# Patient Record
Sex: Female | Born: 2000 | Race: Black or African American | Hispanic: No | Marital: Single | Smoking: Never smoker
Health system: Southern US, Community
[De-identification: ages and names within clinical notes are randomized; demographics above are authoritative.]

## PROBLEM LIST (undated history)

## (undated) DIAGNOSIS — Z889 Allergy status to unspecified drugs, medicaments and biological substances status: Secondary | ICD-10-CM

## (undated) DIAGNOSIS — F419 Anxiety disorder, unspecified: Secondary | ICD-10-CM

## (undated) DIAGNOSIS — F32A Depression, unspecified: Secondary | ICD-10-CM

## (undated) DIAGNOSIS — O139 Gestational [pregnancy-induced] hypertension without significant proteinuria, unspecified trimester: Secondary | ICD-10-CM

## (undated) DIAGNOSIS — F909 Attention-deficit hyperactivity disorder, unspecified type: Secondary | ICD-10-CM

## (undated) NOTE — *Deleted (*Deleted)
FAMILY CONTACT:   CSW attempted to complete Pt's PSA. Phone number in chart for mother, Sonya [name] ([phone]) was dialed x3. CSW reviewed

---
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
## 2022-05-31 ENCOUNTER — Ambulatory Visit: Payer: Medicaid Other | Admitting: Obstetrics and Gynecology

## 2022-09-21 ENCOUNTER — Encounter (HOSPITAL_COMMUNITY): Payer: Self-pay | Admitting: Emergency Medicine

## 2022-09-21 ENCOUNTER — Inpatient Hospital Stay (HOSPITAL_COMMUNITY)
Admission: AD | Admit: 2022-09-21 | Discharge: 2022-09-21 | Disposition: A | Discharge status: Home / Self Care | Payer: Medicaid Other | Attending: Obstetrics and Gynecology | Admitting: Obstetrics and Gynecology

## 2022-09-21 ENCOUNTER — Other Ambulatory Visit: Payer: Self-pay

## 2022-09-21 DIAGNOSIS — Z3A01 Less than 8 weeks gestation of pregnancy: Secondary | ICD-10-CM | POA: Diagnosis not present

## 2022-09-21 DIAGNOSIS — O219 Vomiting of pregnancy, unspecified: Secondary | ICD-10-CM | POA: Diagnosis present

## 2022-09-21 LAB — URINALYSIS, ROUTINE W REFLEX MICROSCOPIC
Bilirubin Urine: NEGATIVE
Glucose, UA: NEGATIVE mg/dL
Hgb urine dipstick: NEGATIVE
Ketones, ur: 80 mg/dL — AB
Leukocytes,Ua: NEGATIVE
Nitrite: NEGATIVE
Protein, ur: 30 mg/dL — AB
Specific Gravity, Urine: 1.03 (ref 1.005–1.030)
pH: 5 (ref 5.0–8.0)

## 2022-09-21 MED ORDER — FAMOTIDINE IN NACL 20-0.9 MG/50ML-% IV SOLN
20.0000 mg | Freq: Once | INTRAVENOUS | Status: AC
  Administered 2022-09-21: 20 mg via INTRAVENOUS
  Filled 2022-09-21: qty 50

## 2022-09-21 MED ORDER — METOCLOPRAMIDE HCL 10 MG PO TABS: 10.0000 mg | ORAL_TABLET | Freq: Three times a day (TID) | ORAL | 0 refills | Status: DC | PRN

## 2022-09-21 MED ORDER — METOCLOPRAMIDE HCL 5 MG/ML IJ SOLN
10.0000 mg | Freq: Once | INTRAMUSCULAR | Status: AC
  Administered 2022-09-21: 10 mg via INTRAVENOUS
  Filled 2022-09-21: qty 2

## 2022-09-21 MED ORDER — LACTATED RINGERS IV BOLUS
1000.0000 mL | Freq: Once | INTRAVENOUS | Status: AC
  Administered 2022-09-21: 1000 mL via INTRAVENOUS

## 2022-09-21 NOTE — ED Triage Notes (Signed)
Patient arrives ambulatory by POV states over the past couple weeks when she is trying to eat she feels like her body is rejecting it and unable to swallow the food. States she is [redacted] weeks pregnant. Waiting for call from OBGYN.

## 2022-09-21 NOTE — ED Provider Triage Note (Signed)
Emergency Medicine Provider Triage Evaluation Note  Sonya [name] , a 21 y.o. female  was evaluated in triage.  Pt complains of anorexia, vomiting, unable to eat.  Patient is first trimester pregnancy.  She had her pregnancy test confirmed at her doctor's office.  Review of Systems  Positive: Nausea vomiting Negative: No abdominal pain or vaginal bleeding  Physical Exam  BP 128/85   Pulse 84   Temp 98.6 F (37 C) (Oral)   Resp 14   LMP 08/14/2022   SpO2 100%  Gen:   Awake, no distress   Resp:  Normal effort  MSK:   Moves extremities without difficulty  Other:    Medical Decision Making  Medically screening exam initiated at 4:37 PM.  Appropriate orders placed.  Sonya [name] was informed that the remainder of the evaluation will be completed by another provider, this initial triage assessment does not replace that evaluation, and the importance of remaining in the ED until their evaluation is complete.  Discussed with MAU provider Sonya [name].  Patient be transferred to MAU   Sonya Rank, MD 09/21/22 [phone]

## 2022-09-21 NOTE — MAU Note (Signed)
Sonya [name] is a 21 y.o. at [redacted]w[redacted]d here in MAU reporting: states when she found out she was pregnant she was struggling to eat. States her body is "rejecting food". States she has not been able to keep anything down. Has not vomited today. But she does feel some nausea. Also been feeling very tired.   LMP: 08/11/2022  Onset of complaint: ongoing  Pain score: 8/10  Vitals:   09/21/22 1620 09/21/22 1653  BP: 128/85 136/77  Pulse: 84 88  Resp: 14 16  Temp: 98.6 F (37 C) 98.3 F (36.8 C)  SpO2: 100%      FHT:NA  Lab orders placed from triage: UA

## 2022-09-21 NOTE — MAU Provider Note (Signed)
History     [id number]  Arrival date and time: 09/21/22 1542    Chief Complaint  Patient presents with   Nausea     HPI Sonya [name] is a 21 y.o. at [redacted]w[redacted]d who presents for nausea. Symptoms started last week but worsened yesterday. Since yesterday she hasn't been able to eat or swallow. Hasn't vomited today. Does not have antiemetic at home. Denies fever, abdominal pain, diarrhea, dysuria, or vaginal bleeding.    OB History     Gravida  1   Para      Term      Preterm      AB      Living         SAB      IAB      Ectopic      Multiple      Live Births              Past Medical History:  Diagnosis Date   ADHD (attention deficit hyperactivity disorder)    no meds currently   Anxiety    History of seasonal allergies     Past Surgical History:  Procedure Laterality Date   CHOLECYSTECTOMY N/A 01/29/2018   Procedure: LAPAROSCOPIC CHOLECYSTECTOMY WITH INTRAOPERATIVE CHOLANGIOGRAM;  Surgeon: Kandice Hams, MD;  Location: [facility];  Service: Pediatrics;  Laterality: N/A;    Family History  Problem Relation Age of Onset   Epilepsy Mother    Schizophrenia Father    Epilepsy Brother     No Known Allergies  No current facility-administered medications on file prior to encounter.   Current Outpatient Medications on File Prior to Encounter  Medication Sig Dispense Refill   Prenatal Vit-Fe Fumarate-FA (WESTAB PLUS) 27-1 MG TABS Take 1 tablet by mouth daily.       ROS Pertinent positives and negative per HPI, all others reviewed and negative  Physical Exam   BP 136/77 (BP Location: Right Arm)   Pulse 88   Temp 98.3 F (36.8 C) (Oral)   Resp 16   Ht 5\' 4"  (1.626 m)   Wt 63.1 kg   LMP 08/14/2022   SpO2 100%   BMI 23.88 kg/m   Patient Vitals for the past 24 hrs:  BP Temp Temp src Pulse Resp SpO2 Height Weight  09/21/22 1653 136/77 98.3 F (36.8 C) Oral 88 16 -- -- --  09/21/22 1649 -- -- -- -- -- -- 5\' 4"  (1.626 m) 63.1 kg  09/21/22  1620 128/85 98.6 F (37 C) Oral 84 14 100 % -- --    Physical Exam Vitals and nursing note reviewed.  Constitutional:      General: She is not in acute distress.    Appearance: Normal appearance.  HENT:     Head: Normocephalic and atraumatic.  Eyes:     General: No scleral icterus.    Conjunctiva/sclera: Conjunctivae normal.  Pulmonary:     Effort: Pulmonary effort is normal. No respiratory distress.  Abdominal:     Palpations: Abdomen is soft.     Tenderness: There is no abdominal tenderness.  Neurological:     Mental Status: She is alert.  Psychiatric:        Mood and Affect: Mood normal.        Behavior: Behavior normal.      Labs Results for orders placed or performed during the hospital encounter of 09/21/22 (from the past 24 hour(s))  Urinalysis, Routine w reflex microscopic Urine, Clean Catch  Status: Abnormal   Collection Time: 09/21/22  4:59 PM  Result Value Ref Range   Color, Urine AMBER (A) YELLOW   APPearance HAZY (A) CLEAR   Specific Gravity, Urine 1.030 1.005 - 1.030   pH 5.0 5.0 - 8.0   Glucose, UA NEGATIVE NEGATIVE mg/dL   Hgb urine dipstick NEGATIVE NEGATIVE   Bilirubin Urine NEGATIVE NEGATIVE   Ketones, ur 80 (A) NEGATIVE mg/dL   Protein, ur 30 (A) NEGATIVE mg/dL   Nitrite NEGATIVE NEGATIVE   Leukocytes,Ua NEGATIVE NEGATIVE   RBC / HPF 0-5 0 - 5 RBC/hpf   WBC, UA 0-5 0 - 5 WBC/hpf   Bacteria, UA RARE (A) NONE SEEN   Squamous Epithelial / LPF 0-5 0 - 5   Mucus PRESENT     Imaging No results found.  MAU Course  Procedures Lab Orders         Urinalysis, Routine w reflex microscopic Urine, Clean Catch    Meds ordered this encounter  Medications   lactated ringers bolus 1,000 mL   metoCLOPramide (REGLAN) injection 10 mg   famotidine (PEPCID) IVPB 20 mg premix   metoCLOPramide (REGLAN) 10 MG tablet    Sig: Take 1 tablet (10 mg total) by mouth every 8 (eight) hours as needed for nausea or vomiting.    Dispense:  30 tablet    Refill:  0     Order Specific Question:   Supervising Provider    Answer:   Griffin Basil [[id number]]   Imaging Orders  No imaging studies ordered today    MDM Presents with nausea - no abdominal pain or vaginal bleeding. Treated in MAU with IV fluids, reglan, & pepcid. Reports improvement in symptoms & able to swallow drink & crackers.  Assessment and Plan   1. Nausea and vomiting during pregnancy prior to [redacted] weeks gestation   2. [redacted] weeks gestation of pregnancy    -Rx reglan  -Reviewed reasons to return to MAU -Start prenatal care  Jorje Guild, NP 09/21/22 7:42 PM

## 2022-10-02 ENCOUNTER — Encounter (HOSPITAL_COMMUNITY): Payer: Self-pay | Admitting: Obstetrics & Gynecology

## 2022-10-02 ENCOUNTER — Inpatient Hospital Stay (HOSPITAL_COMMUNITY)
Admission: AD | Admit: 2022-10-02 | Discharge: 2022-10-02 | Disposition: A | Discharge status: Home / Self Care | Payer: Medicaid Other | Attending: Obstetrics & Gynecology | Admitting: Obstetrics & Gynecology

## 2022-10-02 ENCOUNTER — Other Ambulatory Visit: Payer: Self-pay

## 2022-10-02 DIAGNOSIS — O26891 Other specified pregnancy related conditions, first trimester: Secondary | ICD-10-CM | POA: Insufficient documentation

## 2022-10-02 DIAGNOSIS — Z3A01 Less than 8 weeks gestation of pregnancy: Secondary | ICD-10-CM | POA: Diagnosis not present

## 2022-10-02 DIAGNOSIS — O218 Other vomiting complicating pregnancy: Secondary | ICD-10-CM | POA: Diagnosis not present

## 2022-10-02 DIAGNOSIS — O219 Vomiting of pregnancy, unspecified: Secondary | ICD-10-CM

## 2022-10-02 LAB — CBC WITH DIFFERENTIAL/PLATELET
Abs Immature Granulocytes: 0.02 10*3/uL (ref 0.00–0.07)
Basophils Absolute: 0 10*3/uL (ref 0.0–0.1)
Basophils Relative: 0 %
Eosinophils Absolute: 0.1 10*3/uL (ref 0.0–0.5)
Eosinophils Relative: 2 %
HCT: 37.3 % (ref 36.0–46.0)
Hemoglobin: 12.5 g/dL (ref 12.0–15.0)
Immature Granulocytes: 0 %
Lymphocytes Relative: 18 %
Lymphs Abs: 1.1 10*3/uL (ref 0.7–4.0)
MCH: 30.2 pg (ref 26.0–34.0)
MCHC: 33.5 g/dL (ref 30.0–36.0)
MCV: 90.1 fL (ref 80.0–100.0)
Monocytes Absolute: 0.4 10*3/uL (ref 0.1–1.0)
Monocytes Relative: 6 %
Neutro Abs: 4.3 10*3/uL (ref 1.7–7.7)
Neutrophils Relative %: 74 %
Platelets: 205 10*3/uL (ref 150–400)
RBC: 4.14 MIL/uL (ref 3.87–5.11)
RDW: 11.9 % (ref 11.5–15.5)
WBC: 5.8 10*3/uL (ref 4.0–10.5)
nRBC: 0 % (ref 0.0–0.2)

## 2022-10-02 LAB — COMPREHENSIVE METABOLIC PANEL
ALT: 11 U/L (ref 0–44)
AST: 18 U/L (ref 15–41)
Albumin: 4 g/dL (ref 3.5–5.0)
Alkaline Phosphatase: 33 U/L — ABNORMAL LOW (ref 38–126)
Anion gap: 9 (ref 5–15)
BUN: 6 mg/dL (ref 6–20)
CO2: 23 mmol/L (ref 22–32)
Calcium: 9.4 mg/dL (ref 8.9–10.3)
Chloride: 106 mmol/L (ref 98–111)
Creatinine, Ser: 0.67 mg/dL (ref 0.44–1.00)
GFR, Estimated: >60 mL/min (ref >60)
Glucose, Bld: 100 mg/dL — ABNORMAL HIGH (ref 70–99)
Potassium: 4.4 mmol/L (ref 3.5–5.1)
Sodium: 138 mmol/L (ref 135–145)
Total Bilirubin: 0.4 mg/dL (ref 0.3–1.2)
Total Protein: 6.7 g/dL (ref 6.5–8.1)

## 2022-10-02 LAB — URINALYSIS, ROUTINE W REFLEX MICROSCOPIC
Bilirubin Urine: NEGATIVE
Glucose, UA: NEGATIVE mg/dL
Hgb urine dipstick: NEGATIVE
Ketones, ur: NEGATIVE mg/dL
Leukocytes,Ua: NEGATIVE
Nitrite: NEGATIVE
Protein, ur: NEGATIVE mg/dL
Specific Gravity, Urine: 1.025 (ref 1.005–1.030)
pH: 8 (ref 5.0–8.0)

## 2022-10-02 MED ORDER — PROMETHAZINE HCL 12.5 MG PO TABS: 12.5000 mg | ORAL_TABLET | Freq: Four times a day (QID) | ORAL | 0 refills | Status: DC | PRN

## 2022-10-02 NOTE — MAU Provider Note (Signed)
History     CSN: [id number]  Arrival date and time: 10/02/22 [id number]   Event Date/Time   First Provider Initiated Contact with Patient 10/02/22 1118      Chief Complaint  Patient presents with   Nausea   Emesis   Fatigue   HPI Aalani Mcnellis is a 21 y.o. G1P0 at [redacted]w[redacted]d who presents to MAU with chief complaint of vomiting. This is a recurrent problem for which patient was prescribed Reglan on 09/21/2022. Patient states the Reglan is working for her but she is typically only able to tolerate a small number of different foods. She confirms she is tolerating PO liquids and solids. Patient states she is concerned because she has missed two weeks of work due to feeling weak.  Patient reports one episode of syncope two days ago. She was preparing food in her mother's kitchen. Her mother witness the episode and told the patient she slowly lowered herself to the floor. Patient denies physical complaints from her fall. She denies chest pain, palpitations, hx of cardiac issues.  OB History     Gravida  1   Para      Term      Preterm      AB      Living         SAB      IAB      Ectopic      Multiple      Live Births              Past Medical History:  Diagnosis Date   ADHD (attention deficit hyperactivity disorder)    no meds currently   Anxiety    History of seasonal allergies     Past Surgical History:  Procedure Laterality Date   CHOLECYSTECTOMY N/A 01/29/2018   Procedure: LAPAROSCOPIC CHOLECYSTECTOMY WITH INTRAOPERATIVE CHOLANGIOGRAM;  Surgeon: Stanford Scotland, MD;  Location: [facility];  Service: Pediatrics;  Laterality: N/A;    Family History  Problem Relation Age of Onset   Epilepsy Mother    Schizophrenia Father    Epilepsy Brother     Social History   Tobacco Use   Smoking status: Never   Smokeless tobacco: Never  Vaping Use   Vaping Use: Never used  Substance Use Topics   Alcohol use: Not Currently   Drug use: Yes    Types: Marijuana     Comment: last smoked 2 weeks ago (early October 2023)    Allergies: No Known Allergies  Medications Prior to Admission  Medication Sig Dispense Refill Last Dose   metoCLOPramide (REGLAN) 10 MG tablet Take 1 tablet (10 mg total) by mouth every 8 (eight) hours as needed for nausea or vomiting. 30 tablet 0 10/01/2022 at 1900   Prenatal Vit-Fe Fumarate-FA (WESTAB PLUS) 27-1 MG TABS Take 1 tablet by mouth daily.       Review of Systems  Gastrointestinal:  Positive for nausea and vomiting.  Neurological:  Positive for syncope.  All other systems reviewed and are negative.  Physical Exam   Blood pressure 130/69, pulse 93, temperature 98.2 F (36.8 C), temperature source Oral, resp. rate 18, height 5\' 4"  (1.626 m), weight 63.2 kg, last menstrual period 08/14/2022, SpO2 100 %.  Physical Exam Vitals and nursing note reviewed. Exam conducted with a chaperone present.  Constitutional:      General: She is not in acute distress.    Appearance: She is not ill-appearing.  Cardiovascular:     Rate and Rhythm: Normal rate  and regular rhythm.     Pulses: Normal pulses.     Heart sounds: Normal heart sounds.  Pulmonary:     Effort: Pulmonary effort is normal.     Breath sounds: Normal breath sounds.  Abdominal:     General: Abdomen is flat.  Skin:    General: Skin is warm.     Capillary Refill: Capillary refill takes less than 2 seconds.  Neurological:     Mental Status: She is alert and oriented to person, place, and time.  Psychiatric:        Mood and Affect: Mood normal.        Behavior: Behavior normal.        Thought Content: Thought content normal.        Judgment: Judgment normal.     MAU Course  Procedures  MDM Orders Placed This Encounter  Procedures   Urinalysis, Routine w reflex microscopic Urine, Clean Catch   CBC with Differential/Platelet   Comprehensive metabolic panel   ED EKG   Discharge patient   Patient Vitals for the past 24 hrs:  BP Temp Temp src Pulse  Resp SpO2 Height Weight  10/02/22 1150 119/65 98.5 F (36.9 C) Oral 83 19 100 % -- --  10/02/22 1009 130/69 98.2 F (36.8 C) Oral 93 18 100 % -- --  10/02/22 1005 -- -- -- -- -- -- 5\' 4"  (1.626 m) 63.2 kg   Results for orders placed or performed during the hospital encounter of 10/02/22 (from the past 24 hour(s))  Urinalysis, Routine w reflex microscopic Urine, Clean Catch     Status: None   Collection Time: 10/02/22 10:18 AM  Result Value Ref Range   Color, Urine YELLOW YELLOW   APPearance CLEAR CLEAR   Specific Gravity, Urine 1.025 1.005 - 1.030   pH 8.0 5.0 - 8.0   Glucose, UA NEGATIVE NEGATIVE mg/dL   Hgb urine dipstick NEGATIVE NEGATIVE   Bilirubin Urine NEGATIVE NEGATIVE   Ketones, ur NEGATIVE NEGATIVE mg/dL   Protein, ur NEGATIVE NEGATIVE mg/dL   Nitrite NEGATIVE NEGATIVE   Leukocytes,Ua NEGATIVE NEGATIVE  CBC with Differential/Platelet     Status: None   Collection Time: 10/02/22 10:34 AM  Result Value Ref Range   WBC 5.8 4.0 - 10.5 K/uL   RBC 4.14 3.87 - 5.11 MIL/uL   Hemoglobin 12.5 12.0 - 15.0 g/dL   HCT 37.3 36.0 - 46.0 %   MCV 90.1 80.0 - 100.0 fL   MCH 30.2 26.0 - 34.0 pg   MCHC 33.5 30.0 - 36.0 g/dL   RDW 11.9 11.5 - 15.5 %   Platelets 205 150 - 400 K/uL   nRBC 0.0 0.0 - 0.2 %   Neutrophils Relative % 74 %   Neutro Abs 4.3 1.7 - 7.7 K/uL   Lymphocytes Relative 18 %   Lymphs Abs 1.1 0.7 - 4.0 K/uL   Monocytes Relative 6 %   Monocytes Absolute 0.4 0.1 - 1.0 K/uL   Eosinophils Relative 2 %   Eosinophils Absolute 0.1 0.0 - 0.5 K/uL   Basophils Relative 0 %   Basophils Absolute 0.0 0.0 - 0.1 K/uL   Immature Granulocytes 0 %   Abs Immature Granulocytes 0.02 0.00 - 0.07 K/uL  Comprehensive metabolic panel     Status: Abnormal   Collection Time: 10/02/22 10:34 AM  Result Value Ref Range   Sodium 138 135 - 145 mmol/L   Potassium 4.4 3.5 - 5.1 mmol/L   Chloride 106 98 - 111  mmol/L   CO2 23 22 - 32 mmol/L   Glucose, Bld 100 (H) 70 - 99 mg/dL   BUN 6 6 - 20  mg/dL   Creatinine, Ser 0.67 0.44 - 1.00 mg/dL   Calcium 9.4 8.9 - 10.3 mg/dL   Total Protein 6.7 6.5 - 8.1 g/dL   Albumin 4.0 3.5 - 5.0 g/dL   AST 18 15 - 41 U/L   ALT 11 0 - 44 U/L   Alkaline Phosphatase 33 (L) 38 - 126 U/L   Total Bilirubin 0.4 0.3 - 1.2 mg/dL   GFR, Estimated >60 >60 mL/min   Anion gap 9 5 - 15   Meds ordered this encounter  Medications   promethazine (PHENERGAN) 12.5 MG tablet    Sig: Take 1 tablet (12.5 mg total) by mouth every 6 (six) hours as needed for nausea or vomiting.    Dispense:  30 tablet    Refill:  0    Order Specific Question:   Supervising Provider    Answer:   Janyth Pupa [[id number]]   Assessment and Plan  -21 y.o. G1P0 at [redacted]w[redacted]d  --Well appearing --Vomiting well managed with previously prescribed medications and diet restriction --No ketonuria assessed today --Second antiemetic agent per patient request --Syncope x 1 episode two days ago --S/p normal ECG with NSR in MAU --Discharge home in stable condition  Darlina Rumpf, [place], MSN, CNM 10/02/2022, 12:38 PM

## 2022-10-02 NOTE — Discharge Instructions (Signed)
Vomiting in First Trimester Follow these instructions at home: To help relieve your symptoms, listen to your body. Everyone is different and has different preferences. Find what works best for you. Here are some things you can try to help relieve your symptoms: Meals and snacks Eat 5-6 small meals daily instead of 3 large meals. Eating small meals and snacks can help you avoid an empty stomach. Before getting out of bed, eat a couple of crackers to avoid moving around on an empty stomach. Eat a protein-rich snack before bed. Examples include cheese and crackers, or a peanut butter sandwich made with 1 slice of whole-wheat bread and 1 tsp (5 g) of peanut butter. Eat and drink slowly. Try eating starchy foods as these are usually tolerated well. Examples include cereal, toast, bread, potatoes, pasta, rice, and pretzels. Eat at least one serving of protein with your meals and snacks. Protein options include lean meats, poultry, seafood, beans, nuts, nut butters, eggs, cheese, and yogurt. Eat or suck on things that have ginger in them. It may help to relieve nausea. Add  tsp (0.44 g) ground ginger to hot tea, or choose ginger tea.   Fluids It is important to stay hydrated. Try to: Drink small amounts of fluids often. Drink fluids 30 minutes before or after a meal to help lessen the feeling of a full stomach. Drink 100% fruit juice or an electrolyte drink. An electrolyte drink contains sodium, potassium, and chloride. Drink fluids that are cold, clear, and carbonated or sour. These include lemonade, ginger ale, lemon-lime soda, ice water, and sparkling water. Things to avoid Avoid the following: Eating foods that trigger your symptoms. These may include spicy foods, coffee, high-fat foods, very sweet foods, and acidic foods. Drinking more than 1 cup of fluid at a time. Skipping meals. Nausea can be more intense on an empty stomach. If you cannot tolerate food, do not force it. Try sucking on ice  chips or other frozen items and make up for missed calories later. Lying down within 2 hours after eating. Being exposed to environmental triggers. These may include food smells, smoky rooms, closed spaces, rooms with strong smells, warm or humid places, overly loud and noisy rooms, and rooms with motion or flickering lights. Try eating meals in a well-ventilated area that is free of strong smells. Making quick and sudden changes in your movement. Taking iron pills and multivitamins that contain iron. If you take prescription iron pills, do not stop taking them unless your health care provider approves. Preparing food. The smell of food can spoil your appetite or trigger nausea. General instructions Brush your teeth or use a mouth rinse after meals. Take over-the-counter and prescription medicines only as told by your health care provider. Follow instructions from your health care provider about eating or drinking restrictions. Talk with your health care provider about starting a supplement of vitamin B6. Continue to take your prenatal vitamins as told by your health care provider. If you are having trouble taking your prenatal vitamins, talk with your health care provider about other options. Keep all follow-up visits. This is important. Follow-up visits include prenatal visits. Contact a health care provider if: You have pain in your abdomen. You have a severe headache. You have vision problems. You are losing weight. You feel weak or dizzy. You cannot eat or drink without vomiting, especially if this goes on for a full day. Get help right away if: You cannot drink fluids without vomiting. You vomit blood. You have constant   nausea and vomiting. You are very weak. You faint. You have a fever and your symptoms suddenly get worse. Summary Making some changes to your eating habits may help relieve nausea and vomiting. This condition may be managed with lifestyle changes and medicines as  prescribed by your health care provider. If medicines do not help relieve nausea and vomiting, you may need to receive fluids through an IV at the hospital. This information is not intended to replace advice given to you by your health care provider. Make sure you discuss any questions you have with your health care provider. Document Revised: 06/27/2020 Document Reviewed: 06/27/2020 Elsevier Patient Education  2021 [employer]  

## 2022-10-02 NOTE — MAU Note (Signed)
Sonya [name] is a 21 y.o. at [redacted]w[redacted]d here in MAU reporting: she's has N/V and "low energy".  Reports abel to keep both fluids and "light" foods down.  Reports hasn't vomited in last 24 hours. Reports fainted 2 days ago. LMP: N/A Onset of complaint: 1 week since fatigued Pain score: 0 Vitals:   10/02/22 1009  BP: 130/69  Pulse: 93  Resp: 18  Temp: 98.2 F (36.8 C)  SpO2: 100%     FHT:N/A Lab orders placed from triage:   UA

## 2022-10-02 NOTE — Progress Notes (Signed)
Pt reports she no longer has a legal guardian.  Legal guardian was for previous behavioral health admission @ age 21.

## 2022-10-16 ENCOUNTER — Encounter (HOSPITAL_COMMUNITY): Payer: Self-pay

## 2022-10-16 ENCOUNTER — Inpatient Hospital Stay (HOSPITAL_COMMUNITY)
Admission: EM | Admit: 2022-10-16 | Discharge: 2022-10-16 | Disposition: A | Discharge status: Home / Self Care | Payer: Medicaid Other | Attending: Obstetrics and Gynecology | Admitting: Obstetrics and Gynecology

## 2022-10-16 DIAGNOSIS — Z3A09 9 weeks gestation of pregnancy: Secondary | ICD-10-CM | POA: Diagnosis not present

## 2022-10-16 DIAGNOSIS — Z79899 Other long term (current) drug therapy: Secondary | ICD-10-CM | POA: Diagnosis not present

## 2022-10-16 DIAGNOSIS — O99611 Diseases of the digestive system complicating pregnancy, first trimester: Secondary | ICD-10-CM | POA: Insufficient documentation

## 2022-10-16 DIAGNOSIS — R55 Syncope and collapse: Secondary | ICD-10-CM | POA: Diagnosis not present

## 2022-10-16 DIAGNOSIS — O99321 Drug use complicating pregnancy, first trimester: Secondary | ICD-10-CM | POA: Insufficient documentation

## 2022-10-16 DIAGNOSIS — K5900 Constipation, unspecified: Secondary | ICD-10-CM | POA: Insufficient documentation

## 2022-10-16 DIAGNOSIS — R519 Headache, unspecified: Secondary | ICD-10-CM | POA: Diagnosis not present

## 2022-10-16 DIAGNOSIS — F410 Panic disorder [episodic paroxysmal anxiety] without agoraphobia: Secondary | ICD-10-CM | POA: Insufficient documentation

## 2022-10-16 DIAGNOSIS — O210 Mild hyperemesis gravidarum: Secondary | ICD-10-CM | POA: Diagnosis not present

## 2022-10-16 DIAGNOSIS — R112 Nausea with vomiting, unspecified: Secondary | ICD-10-CM | POA: Diagnosis not present

## 2022-10-16 DIAGNOSIS — F1290 Cannabis use, unspecified, uncomplicated: Secondary | ICD-10-CM | POA: Diagnosis not present

## 2022-10-16 DIAGNOSIS — R42 Dizziness and giddiness: Secondary | ICD-10-CM | POA: Diagnosis not present

## 2022-10-16 DIAGNOSIS — S0990XA Unspecified injury of head, initial encounter: Secondary | ICD-10-CM

## 2022-10-16 DIAGNOSIS — R0789 Other chest pain: Secondary | ICD-10-CM | POA: Diagnosis not present

## 2022-10-16 DIAGNOSIS — R0602 Shortness of breath: Secondary | ICD-10-CM | POA: Insufficient documentation

## 2022-10-16 DIAGNOSIS — K219 Gastro-esophageal reflux disease without esophagitis: Secondary | ICD-10-CM

## 2022-10-16 DIAGNOSIS — W2209XA Striking against other stationary object, initial encounter: Secondary | ICD-10-CM | POA: Insufficient documentation

## 2022-10-16 DIAGNOSIS — O26891 Other specified pregnancy related conditions, first trimester: Secondary | ICD-10-CM | POA: Insufficient documentation

## 2022-10-16 DIAGNOSIS — Z1152 Encounter for screening for COVID-19: Secondary | ICD-10-CM | POA: Insufficient documentation

## 2022-10-16 LAB — COMPREHENSIVE METABOLIC PANEL
ALT: 17 U/L (ref 0–44)
AST: 21 U/L (ref 15–41)
Albumin: 4.4 g/dL (ref 3.5–5.0)
Alkaline Phosphatase: 32 U/L — ABNORMAL LOW (ref 38–126)
Anion gap: 14 (ref 5–15)
BUN: 9 mg/dL (ref 6–20)
CO2: 18 mmol/L — ABNORMAL LOW (ref 22–32)
Calcium: 10.4 mg/dL — ABNORMAL HIGH (ref 8.9–10.3)
Chloride: 105 mmol/L (ref 98–111)
Creatinine, Ser: 0.69 mg/dL (ref 0.44–1.00)
GFR, Estimated: >60 mL/min (ref >60)
Glucose, Bld: 92 mg/dL (ref 70–99)
Potassium: 4.7 mmol/L (ref 3.5–5.1)
Sodium: 137 mmol/L (ref 135–145)
Total Bilirubin: 1.3 mg/dL — ABNORMAL HIGH (ref 0.3–1.2)
Total Protein: 7.4 g/dL (ref 6.5–8.1)

## 2022-10-16 LAB — URINALYSIS, ROUTINE W REFLEX MICROSCOPIC
Bacteria, UA: NONE SEEN
Bilirubin Urine: NEGATIVE
Glucose, UA: NEGATIVE mg/dL
Hgb urine dipstick: NEGATIVE
Ketones, ur: 80 mg/dL — AB
Leukocytes,Ua: NEGATIVE
Nitrite: NEGATIVE
Protein, ur: 30 mg/dL — AB
Specific Gravity, Urine: 1.032 — ABNORMAL HIGH (ref 1.005–1.030)
pH: 5 (ref 5.0–8.0)

## 2022-10-16 LAB — CBC WITH DIFFERENTIAL/PLATELET
Abs Immature Granulocytes: 0.01 10*3/uL (ref 0.00–0.07)
Basophils Absolute: 0 10*3/uL (ref 0.0–0.1)
Basophils Relative: 0 %
Eosinophils Absolute: 0 10*3/uL (ref 0.0–0.5)
Eosinophils Relative: 0 %
HCT: 38.5 % (ref 36.0–46.0)
Hemoglobin: 13.1 g/dL (ref 12.0–15.0)
Immature Granulocytes: 0 %
Lymphocytes Relative: 14 %
Lymphs Abs: 0.9 10*3/uL (ref 0.7–4.0)
MCH: 30.4 pg (ref 26.0–34.0)
MCHC: 34 g/dL (ref 30.0–36.0)
MCV: 89.3 fL (ref 80.0–100.0)
Monocytes Absolute: 0.4 10*3/uL (ref 0.1–1.0)
Monocytes Relative: 7 %
Neutro Abs: 4.9 10*3/uL (ref 1.7–7.7)
Neutrophils Relative %: 79 %
Platelets: 268 10*3/uL (ref 150–400)
RBC: 4.31 MIL/uL (ref 3.87–5.11)
RDW: 12 % (ref 11.5–15.5)
WBC: 6.2 10*3/uL (ref 4.0–10.5)
nRBC: 0 % (ref 0.0–0.2)

## 2022-10-16 LAB — RESP PANEL BY RT-PCR (FLU A&B, COVID) ARPGX2
Influenza A by PCR: NEGATIVE
Influenza B by PCR: NEGATIVE
SARS Coronavirus 2 by RT PCR: NEGATIVE

## 2022-10-16 LAB — HCG, QUANTITATIVE, PREGNANCY: hCG, Beta Chain, Quant, S: 198516 m[IU]/mL — ABNORMAL HIGH (ref <5)

## 2022-10-16 LAB — LIPASE, BLOOD: Lipase: 25 U/L (ref 11–51)

## 2022-10-16 MED ORDER — PROMETHAZINE HCL 25 MG PO TABS
25.0000 mg | ORAL_TABLET | Freq: Once | ORAL | Status: AC
  Administered 2022-10-16: 25 mg via ORAL
  Filled 2022-10-16: qty 1

## 2022-10-16 MED ORDER — ONDANSETRON HCL 4 MG/2ML IJ SOLN
4.0000 mg | Freq: Once | INTRAMUSCULAR | Status: AC
  Administered 2022-10-16: 4 mg via INTRAVENOUS
  Filled 2022-10-16: qty 2

## 2022-10-16 MED ORDER — LACTATED RINGERS IV BOLUS
1000.0000 mL | Freq: Once | INTRAVENOUS | Status: AC
  Administered 2022-10-16: 1000 mL via INTRAVENOUS

## 2022-10-16 MED ORDER — PANTOPRAZOLE SODIUM 40 MG IV SOLR
40.0000 mg | Freq: Once | INTRAVENOUS | Status: AC
  Administered 2022-10-16: 40 mg via INTRAVENOUS
  Filled 2022-10-16: qty 10

## 2022-10-16 MED ORDER — PANTOPRAZOLE SODIUM 20 MG PO TBEC: 20.0000 mg | DELAYED_RELEASE_TABLET | Freq: Every day | ORAL | 6 refills | Status: DC

## 2022-10-16 MED ORDER — POLYETHYLENE GLYCOL 3350 17 GM/SCOOP PO POWD: 17.0000 g | Freq: Two times a day (BID) | ORAL | 2 refills | Status: DC | PRN

## 2022-10-16 MED ORDER — ONDANSETRON HCL 8 MG PO TABS: 8.0000 mg | ORAL_TABLET | Freq: Four times a day (QID) | ORAL | 3 refills | Status: DC | PRN

## 2022-10-16 MED ORDER — GLYCOPYRROLATE 0.2 MG/ML IJ SOLN
0.1000 mg | Freq: Once | INTRAMUSCULAR | Status: AC
  Administered 2022-10-16: 0.1 mg via INTRAVENOUS
  Filled 2022-10-16: qty 1

## 2022-10-16 MED ORDER — FAMOTIDINE IN NACL 20-0.9 MG/50ML-% IV SOLN
20.0000 mg | Freq: Once | INTRAVENOUS | Status: AC
  Administered 2022-10-16: 20 mg via INTRAVENOUS
  Filled 2022-10-16: qty 50

## 2022-10-16 MED ORDER — M.V.I. ADULT IV INJ
Freq: Once | INTRAVENOUS | Status: AC
  Filled 2022-10-16: qty 1000

## 2022-10-16 MED ORDER — PROMETHAZINE HCL 12.5 MG PO TABS: 25.0000 mg | ORAL_TABLET | Freq: Four times a day (QID) | ORAL | 3 refills | Status: DC | PRN

## 2022-10-16 MED ORDER — GLYCOPYRROLATE 2 MG PO TABS: 2.0000 mg | ORAL_TABLET | Freq: Three times a day (TID) | ORAL | 3 refills | Status: DC | PRN

## 2022-10-16 NOTE — MAU Provider Note (Cosign Needed Addendum)
History    CSN: [id number]  Arrival date and time: 10/16/22 1138   Event Date/Time   First Provider Initiated Contact with Patient 10/16/22 1311      Chief Complaint  Patient presents with   Emesis   Dizziness   Near Syncope   HPI Sonya [name] is a 21 y.o. G1P0 [redacted]w[redacted]d who presents to MAU after weeks of vomiting and a fall today in which she states she hit her head. She states she has been having worsened vomiting the past week which has not been controlled with medication. She tried to take phenergan yesterday but could not keep it down. She states that she had very bad nausea/vomiting since last night. She states she has not been able to keep any food/water down. She vomited twice when she got up and was not feeling well. She states that she was laying in bed and began having chest tightness and SOB. She called her mother and was told to call EMS. EMS asked her to unlock the door for them. She went to go unlock the door and began to get dizzy/lightheaded when walking to the door. She states that when she turned after unlocking the door she fell to her knees and then hit the left side of her head. She states she lost consciousness and didn't regain consciousness until EMS arrived. She is unsure how long she had LOC. She states when she regained consciousness EMS was around her and she had a panic attack. She says after her fall she has a pulsatile left sided headache. Her nausea/vomiting has not changed since fall. No visual disturbances after her fall.    (CNM Addendum: Pt gave conflicting reports to various staff regarding whether she lost consciousness or not. She stated to CNM that she could hear people talking throughout.)  OB History     Gravida  1   Para      Term      Preterm      AB      Living         SAB      IAB      Ectopic      Multiple      Live Births              Past Medical History:  Diagnosis Date   ADHD (attention deficit hyperactivity  disorder)    no meds currently   Anxiety    History of seasonal allergies     Past Surgical History:  Procedure Laterality Date   CHOLECYSTECTOMY N/A 01/29/2018   Procedure: LAPAROSCOPIC CHOLECYSTECTOMY WITH INTRAOPERATIVE CHOLANGIOGRAM;  Surgeon: Kandice Hams, MD;  Location: [facility] OR;  Service: Pediatrics;  Laterality: N/A;    Family History  Problem Relation Age of Onset   Epilepsy Mother    Schizophrenia Father    Epilepsy Brother     Social History   Tobacco Use   Smoking status: Never   Smokeless tobacco: Never  Vaping Use   Vaping Use: Never used  Substance Use Topics   Alcohol use: Not Currently   Drug use: Yes    Types: Marijuana    Comment: last smoked 2 weeks ago (early October 2023)    Allergies: No Known Allergies  Medications Prior to Admission  Medication Sig Dispense Refill Last Dose   metoCLOPramide (REGLAN) 10 MG tablet Take 1 tablet (10 mg total) by mouth every 8 (eight) hours as needed for nausea or vomiting. 30 tablet 0  Prenatal Vit-Fe Fumarate-FA (WESTAB PLUS) 27-1 MG TABS Take 1 tablet by mouth daily.      promethazine (PHENERGAN) 12.5 MG tablet Take 1 tablet (12.5 mg total) by mouth every 6 (six) hours as needed for nausea or vomiting. 30 tablet 0     Review of Systems  Constitutional:  Positive for appetite change. Negative for fever.  Eyes:  Negative for photophobia and visual disturbance.  Respiratory:  Positive for chest tightness, shortness of breath and stridor.   Cardiovascular:  Positive for chest pain.  Gastrointestinal:  Positive for abdominal pain, constipation, nausea and vomiting. Negative for blood in stool and diarrhea.  Neurological:  Positive for dizziness, light-headedness and headaches (L sided, pulsatile).   Physical Exam   Blood pressure 113/67, pulse 87, temperature 99.8 F (37.7 C), temperature source Oral, resp. rate 16, height 5\' 4"  (1.626 m), weight 60.8 kg, last menstrual period 08/14/2022, SpO2 100  %.  Physical Exam Constitutional:      Appearance: Normal appearance. She is normal weight.  Cardiovascular:     Rate and Rhythm: Normal rate and regular rhythm.  Pulmonary:     Effort: Pulmonary effort is normal.     Breath sounds: Normal breath sounds.     Comments: Pt was having possible stridor on first assessment by CNM, but breathing had normalized by my assessment Abdominal:     General: Abdomen is flat. Bowel sounds are normal.     Palpations: Abdomen is soft.  Musculoskeletal:     Cervical back: Normal range of motion.  Skin:    General: Skin is warm and dry.  Neurological:     General: No focal deficit present.     Mental Status: She is alert and oriented to person, place, and time. Mental status is at baseline.  Psychiatric:        Mood and Affect: Mood normal.        Behavior: Behavior normal.        Judgment: Judgment normal.    MAU Course  Procedures Meds ordered this encounter  Medications   ondansetron (ZOFRAN) injection 4 mg   lactated ringers bolus 1,000 mL   famotidine (PEPCID) IVPB 20 mg premix   lactated ringers 1,000 mL with M.V.I. Adult (INFUVITE ADULT) 10 mL infusion   glycopyrrolate (ROBINUL) injection 0.1 mg   glycopyrrolate (ROBINUL) injection 0.1 mg   promethazine (PHENERGAN) tablet 25 mg   pantoprazole (PROTONIX) injection 40 mg   ondansetron (ZOFRAN) injection 4 mg   lactated ringers bolus 1,000 mL   Orders Placed This Encounter  Procedures   Resp Panel by RT-PCR (Flu A&B, Covid) Anterior Nasal Swab   Culture, OB Urine   Comprehensive metabolic panel   CBC with Differential   hCG, quantitative, pregnancy   Urinalysis, Routine w reflex microscopic   Lipase, blood   I-Stat beta hCG blood, ED   EKG 12-Lead   Insert peripheral IV   Discharge patient   -Pt was given IV zofran and pepcid for N/V and burning pain; pt also received 2 L of IV LR -Consulted ED MD RE: Head injury. Discussed Hx, labs, exam w/ Dr. 08/16/2022. The Wallace Cull[place] CT Head  injury rule was used to rule out the need for a head CT. -Pt felt much better after meds and IVF; she still feels nausea and is spitting saliva but was able to keep juice down; she was given IV robinul for the increased saliva production and PO phenergan for continued nausea -She continued to have nausea  and burning throat discomfort so she was given an additional 4mg  Zofran IV and Protonix 40 mg IV -UA was able to be collected after 2 L IVF and urine was amber; an additional 1 L IV LR bolus was ordered -Pt is feeling better, still has some nausea that comes and goes; has been tolerating PO -Pt states she is ready to go home  EKG Reviewed by Dr. Nehemiah Settle. NSR.   MDM  Results for orders placed or performed during the hospital encounter of 10/16/22 (from the past 24 hour(s))  Resp Panel by RT-PCR (Flu A&B, Covid) Anterior Nasal Swab     Status: None   Collection Time: 10/16/22 11:59 AM   Specimen: Anterior Nasal Swab  Result Value Ref Range   SARS Coronavirus 2 by RT PCR NEGATIVE NEGATIVE   Influenza A by PCR NEGATIVE NEGATIVE   Influenza B by PCR NEGATIVE NEGATIVE  Comprehensive metabolic panel     Status: Abnormal   Collection Time: 10/16/22 12:10 PM  Result Value Ref Range   Sodium 137 135 - 145 mmol/L   Potassium 4.7 3.5 - 5.1 mmol/L   Chloride 105 98 - 111 mmol/L   CO2 18 (L) 22 - 32 mmol/L   Glucose, Bld 92 70 - 99 mg/dL   BUN 9 6 - 20 mg/dL   Creatinine, Ser 0.69 0.44 - 1.00 mg/dL   Calcium 10.4 (H) 8.9 - 10.3 mg/dL   Total Protein 7.4 6.5 - 8.1 g/dL   Albumin 4.4 3.5 - 5.0 g/dL   AST 21 15 - 41 U/L   ALT 17 0 - 44 U/L   Alkaline Phosphatase 32 (L) 38 - 126 U/L   Total Bilirubin 1.3 (H) 0.3 - 1.2 mg/dL   GFR, Estimated >60 >60 mL/min   Anion gap 14 5 - 15  CBC with Differential     Status: None   Collection Time: 10/16/22 12:10 PM  Result Value Ref Range   WBC 6.2 4.0 - 10.5 K/uL   RBC 4.31 3.87 - 5.11 MIL/uL   Hemoglobin 13.1 12.0 - 15.0 g/dL   HCT 38.5 36.0 - 46.0 %    MCV 89.3 80.0 - 100.0 fL   MCH 30.4 26.0 - 34.0 pg   MCHC 34.0 30.0 - 36.0 g/dL   RDW 12.0 11.5 - 15.5 %   Platelets 268 150 - 400 K/uL   nRBC 0.0 0.0 - 0.2 %   Neutrophils Relative % 79 %   Neutro Abs 4.9 1.7 - 7.7 K/uL   Lymphocytes Relative 14 %   Lymphs Abs 0.9 0.7 - 4.0 K/uL   Monocytes Relative 7 %   Monocytes Absolute 0.4 0.1 - 1.0 K/uL   Eosinophils Relative 0 %   Eosinophils Absolute 0.0 0.0 - 0.5 K/uL   Basophils Relative 0 %   Basophils Absolute 0.0 0.0 - 0.1 K/uL   Immature Granulocytes 0 %   Abs Immature Granulocytes 0.01 0.00 - 0.07 K/uL  hCG, quantitative, pregnancy     Status: Abnormal   Collection Time: 10/16/22 12:10 PM  Result Value Ref Range   hCG, Beta Chain, Quant, S 198,516 (H) <5 mIU/mL  Lipase, blood     Status: None   Collection Time: 10/16/22 12:10 PM  Result Value Ref Range   Lipase 25 11 - 51 U/L  Urinalysis, Routine w reflex microscopic     Status: Abnormal   Collection Time: 10/16/22  4:48 PM  Result Value Ref Range   Color, Urine AMBER (A)  YELLOW   APPearance HAZY (A) CLEAR   Specific Gravity, Urine 1.032 (H) 1.005 - 1.030   pH 5.0 5.0 - 8.0   Glucose, UA NEGATIVE NEGATIVE mg/dL   Hgb urine dipstick NEGATIVE NEGATIVE   Bilirubin Urine NEGATIVE NEGATIVE   Ketones, ur 80 (A) NEGATIVE mg/dL   Protein, ur 30 (A) NEGATIVE mg/dL   Nitrite NEGATIVE NEGATIVE   Leukocytes,Ua NEGATIVE NEGATIVE   RBC / HPF 0-5 0 - 5 RBC/hpf   WBC, UA 0-5 0 - 5 WBC/hpf   Bacteria, UA NONE SEEN NONE SEEN   Squamous Epithelial / LPF 0-5 0 - 5   Mucus PRESENT      Assessment and Plan  -21 y.o. G1P0, [redacted]w[redacted]d -Patient is stable for discharge Nausea/vomiting  -Zofran 8 mg q6 PRN, Phenergan increased dose to 25 mg q6  -Protonix 20 mg daily  -Robinul 2 mg TID Dizziness/Near syncope  -Likely due to dehydration from vomiting and lack of fluid intake  -control N/V with above tx and maintain proper hydration  Headache  -PRN tylenol  Constipation -Pt advised to  increase fiber and water intake; start taking Miralax BID  Justice Deeds PA-S 10/16/2022, 1:43 PM   I performed the exam and agree with above.  Katrinka Blazing, [place], CNM 10/22/2022 9:26 AM

## 2022-10-16 NOTE — ED Triage Notes (Signed)
Pt states that she has been throwing up x 1 week . Pt states that she fell today. Pt states that she is having generalized body aches.

## 2022-10-16 NOTE — ED Provider Triage Note (Signed)
Emergency Medicine Provider Triage Evaluation Note  Sonya [name] , a 21 y.o. female  was evaluated in triage.  Pt complains of increased nausea and vomiting over the last week.  Found out the end of September she was pregnant.  Has not yet seen OB/GYN.  Unsure of LMP believes she may be 2 to 3 months pregnant.  Increased abdominal pain with vomiting.  Now with generalized body aches.  Attempted to climb into the ambulance to come to the ED, states she fell and began hyperventilating.  Believes she may be having a panic attack.  Denies fever, changes in bowel habit, headache.  Denies vaginal or urinary symptoms.  Review of Systems  Positive:  Negative: See above  Physical Exam  BP 121/80 (BP Location: Left Arm)   Pulse 96   Temp 99.8 F (37.7 C) (Oral)   Resp 17   Ht 5\' 4"  (1.626 m)   Wt 65.8 kg   LMP 08/14/2022   SpO2 98%   BMI 24.89 kg/m  Gen:   Awake, no distress, sitting comfortably Resp:  Normal effort, communicating well without difficulty, equal chest rise MSK:   Moves extremities without difficulty  Other:  Abdomen soft, moderate generalized tenderness.  No flank tenderness.  Makes appropriate eye contact.  Coordination appears grossly intact.  Medical Decision Making  Medically screening exam initiated at 12:01 PM.  Appropriate orders placed.  Chandi Nicklin was informed that the remainder of the evaluation will be completed by another provider, this initial triage assessment does not replace that evaluation, and the importance of remaining in the ED until their evaluation is complete.     Prince Rome, PA-C 88/41/66 1204

## 2022-10-16 NOTE — MAU Note (Signed)
Brought in by paramedics, couldn't breath, felt like her chest was closing up. After she unlocked the door for the paramedics, she got dizzy and fell. Had a panic attack. Throwing up :the lining of her stomach".  Feeling dizzy and still kind of short of breath.burning in her chest 'very bad'.  Knew she was pregnant, was here 2 wks ago.  Is on meds for vomiting, last taken yesterday, "threw it right back up." Pain in lower abd, cramping and pulling. Denies vag bleeding.    Sheriece Jefcoat is a 21 y.o. at [redacted]w[redacted]d.  Onset of complaint: this morning Pain score: 10- abd, chest 10, 10-head from fall Vitals:   10/16/22 1258 10/16/22 1259  BP:  113/67  Pulse:  87  Resp:  16  Temp:    SpO2: 100% 100%      Lab orders placed from triage:

## 2022-10-16 NOTE — ED Triage Notes (Signed)
EMS stated, pt thinks she has a panic attack and is [redacted] weeks pregnant. When walking to the door she fell down.

## 2022-10-17 LAB — CULTURE, OB URINE: Culture: NO GROWTH

## 2022-10-18 ENCOUNTER — Ambulatory Visit (INDEPENDENT_AMBULATORY_CARE_PROVIDER_SITE_OTHER): Payer: Medicaid Other

## 2022-10-18 ENCOUNTER — Ambulatory Visit: Payer: Medicaid Other | Admitting: *Deleted

## 2022-10-18 VITALS — BP 109/74 | HR 87 | Ht 66.0 in | Wt 139.2 lb

## 2022-10-18 DIAGNOSIS — Z3480 Encounter for supervision of other normal pregnancy, unspecified trimester: Secondary | ICD-10-CM

## 2022-10-18 DIAGNOSIS — O30041 Twin pregnancy, dichorionic/diamniotic, first trimester: Secondary | ICD-10-CM

## 2022-10-18 DIAGNOSIS — O3680X0 Pregnancy with inconclusive fetal viability, not applicable or unspecified: Secondary | ICD-10-CM | POA: Diagnosis not present

## 2022-10-18 MED ORDER — BLOOD PRESSURE KIT DEVI: 1.0000 | 0 refills | Status: DC

## 2022-10-18 NOTE — Progress Notes (Signed)
New OB Intake  I connected withNAME@ on 10/18/22 at  1:10 PM EDT by In Person Visit and verified that I am speaking with the correct person using two identifiers. Nurse is located at [facility] and pt is located at [place].  I discussed the limitations, risks, security and privacy concerns of performing an evaluation and management service by telephone and the availability of in person appointments. I also discussed with the patient that there may be a patient responsible charge related to this service. The patient expressed understanding and agreed to proceed.  I explained I am completing New OB Intake today. We discussed EDD of 05/20/22 that is based on LMP of 08/14/22. Pt is G1/P0. I reviewed her allergies, medications, Medical/Surgical/OB history, and appropriate screenings. I informed her of [facility] services. [facility] information placed in AVS. Based on history, this is a low risk pregnancy.  Patient Active Problem List   Diagnosis Date Noted   MDD (major depressive disorder), recurrent episode, severe ([place]) 10/20/2020   Cholelithiasis 12/30/2017    Concerns addressed today  Delivery Plans Plans to deliver at [facility]. Patient given information for [facility] Healthy Baby website for more information about Women's and [place]. Patient is not interested in water birth. Offered upcoming OB visit with CNM to discuss further.  MyChart/Babyscripts MyChart access verified. I explained pt will have some visits in office and some virtually. Babyscripts instructions given and order placed. Patient verifies receipt of registration text/e-mail. Account successfully created and app downloaded.  Blood Pressure Cuff/Weight Scale Blood pressure cuff ordered for patient to pick-up from [employer]. Explained after first prenatal appt pt will check weekly and document in 61. Patient does not have weight scale; patient may purchase if they desire to track weight weekly in Babyscripts.  Anatomy  US Explained first scheduled [place] will be around 19 weeks. Anatomy US scheduled for 19 wks at [facility]. Pt notified to arrive at [facility].  Labs Discussed Johnsie Cancel genetic screening with patient. Would like both Panorama and Horizon drawn at new OB visit. Routine prenatal labs needed.  COVID Vaccine Patient has not had COVID vaccine.   Social Determinants of Health Food Insecurity: Patient denies food insecurity. WIC Referral: Patient is interested in referral to [facility].  Transportation: Patient denies transportation needs. Childcare: Discussed no children allowed at ultrasound appointments. Offered childcare services; patient declines childcare services at this time.  First visit review I reviewed new OB appt with patient. I explained they will have a provider visit that includes labs and self swab. Explained pt will be seen by Fatima Blank at first visit; encounter routed to appropriate provider. Explained that patient will be seen by pregnancy navigator following visit with provider.   Penny Pia, RN 10/18/2022  1:13 PM

## 2022-10-19 NOTE — Progress Notes (Signed)
I have reviewed this chart and agree with the nurse's documentation.   Fatima Blank, CNM

## 2022-10-22 ENCOUNTER — Other Ambulatory Visit: Payer: Self-pay | Admitting: *Deleted

## 2022-10-22 DIAGNOSIS — Z3480 Encounter for supervision of other normal pregnancy, unspecified trimester: Secondary | ICD-10-CM

## 2022-10-22 DIAGNOSIS — O30042 Twin pregnancy, dichorionic/diamniotic, second trimester: Secondary | ICD-10-CM

## 2022-10-23 ENCOUNTER — Telehealth: Payer: Self-pay | Admitting: Obstetrics and Gynecology

## 2022-10-23 MED ORDER — PRENATAL 28-0.8 MG PO TABS: 1.0000 | ORAL_TABLET | Freq: Every day | ORAL | 12 refills | Status: DC

## 2022-10-23 NOTE — Telephone Encounter (Signed)
Patient called to check on her prenatal vitamin rx.  It was never sent in following her intake appt.   Routed to pharmacy.

## 2022-10-27 ENCOUNTER — Inpatient Hospital Stay (HOSPITAL_COMMUNITY): Payer: Medicaid Other

## 2022-10-27 ENCOUNTER — Encounter (HOSPITAL_COMMUNITY): Payer: Self-pay | Admitting: Obstetrics and Gynecology

## 2022-10-27 ENCOUNTER — Inpatient Hospital Stay (HOSPITAL_COMMUNITY)
Admission: AD | Admit: 2022-10-27 | Discharge: 2022-10-27 | Disposition: A | Discharge status: Home / Self Care | Payer: Medicaid Other | Attending: Obstetrics and Gynecology | Admitting: Obstetrics and Gynecology

## 2022-10-27 DIAGNOSIS — Z3A10 10 weeks gestation of pregnancy: Secondary | ICD-10-CM | POA: Insufficient documentation

## 2022-10-27 DIAGNOSIS — O99611 Diseases of the digestive system complicating pregnancy, first trimester: Secondary | ICD-10-CM | POA: Diagnosis not present

## 2022-10-27 DIAGNOSIS — O99321 Drug use complicating pregnancy, first trimester: Secondary | ICD-10-CM | POA: Insufficient documentation

## 2022-10-27 DIAGNOSIS — O26891 Other specified pregnancy related conditions, first trimester: Secondary | ICD-10-CM | POA: Insufficient documentation

## 2022-10-27 DIAGNOSIS — O30041 Twin pregnancy, dichorionic/diamniotic, first trimester: Secondary | ICD-10-CM | POA: Diagnosis not present

## 2022-10-27 DIAGNOSIS — K5900 Constipation, unspecified: Secondary | ICD-10-CM | POA: Insufficient documentation

## 2022-10-27 DIAGNOSIS — R109 Unspecified abdominal pain: Secondary | ICD-10-CM | POA: Insufficient documentation

## 2022-10-27 DIAGNOSIS — M549 Dorsalgia, unspecified: Secondary | ICD-10-CM | POA: Insufficient documentation

## 2022-10-27 DIAGNOSIS — O26899 Other specified pregnancy related conditions, unspecified trimester: Secondary | ICD-10-CM

## 2022-10-27 DIAGNOSIS — Z3480 Encounter for supervision of other normal pregnancy, unspecified trimester: Secondary | ICD-10-CM

## 2022-10-27 LAB — URINALYSIS, ROUTINE W REFLEX MICROSCOPIC
Bacteria, UA: NONE SEEN
Bilirubin Urine: NEGATIVE
Glucose, UA: NEGATIVE mg/dL
Hgb urine dipstick: NEGATIVE
Ketones, ur: 20 mg/dL — AB
Leukocytes,Ua: NEGATIVE
Nitrite: NEGATIVE
Protein, ur: 100 mg/dL — AB
Specific Gravity, Urine: 1.032 — ABNORMAL HIGH (ref 1.005–1.030)
pH: 5 (ref 5.0–8.0)

## 2022-10-27 MED ORDER — DOCUSATE SODIUM 250 MG PO CAPS: 250.0000 mg | ORAL_CAPSULE | Freq: Every day | ORAL | 0 refills | Status: DC

## 2022-10-27 MED ORDER — SORBITOL 70 % SOLN
960.0000 mL | TOPICAL_OIL | Freq: Once | ORAL | Status: AC
  Administered 2022-10-27: 960 mL via RECTAL
  Filled 2022-10-27: qty 240

## 2022-10-27 NOTE — MAU Note (Signed)
Patient tolerated SMOG enema well. Up to restroom 5 minutes post administration of enema.

## 2022-10-27 NOTE — Discharge Instructions (Signed)
You have constipation which is hard stools that are difficult to pass. It is important to have regular bowel movements every 1-3 days that are soft and easy to pass. Hard stools increase your risk of hemorrhoids and are very uncomfortable.   To prevent constipation you can increase the amount of fiber in your diet. Examples of foods with fiber are leafy greens, whole grain breads, oatmeal and other grains.  It is also important to drink at least eight 8oz glass of water everyday.   If you have not has a bowel movement in 4-5 days you made need to clean out your bowel.  This will have establish normal movement through your bowel.    Miralax Clean out Take 8 capfuls of miralax in 64 oz of gatorade. You can use any fluid that appeals to you (gatorade, water, juice) Continue to drink at least eight 8 oz glasses of water throughout the day You can repeat with another 8 capfuls of miralax in 64 oz of gatorade if you are not having a large amount of stools You will need to be at home and close to a bathroom for about 8 hours when you do the above as you may need to go to the bathroom frequently.   After you are cleaned out: - Start Colace100mg twice daily - Start Miralax once daily - Start a daily fiber supplement like metamucil or citrucel - You can safely use enemas in pregnancy  - if you are having diarrhea you can reduce to Colace once a day or miralax every other day or a 1/2 capful daily.                    Safe Medications in Pregnancy    Acne: Benzoyl Peroxide Salicylic Acid  Backache/Headache: Tylenol: 2 regular strength every 4 hours OR              2 Extra strength every 6 hours  Colds/Coughs/Allergies: Benadryl (alcohol free) 25 mg every 6 hours as needed Breath right strips Claritin Cepacol throat lozenges Chloraseptic throat spray Cold-Eeze- up to three times per day Cough drops, alcohol free Flonase (by prescription only) Guaifenesin Mucinex Robitussin DM (plain only,  alcohol free) Saline nasal spray/drops Sudafed (pseudoephedrine) & Actifed ** use only after [redacted] weeks gestation and if you do not have high blood pressure Tylenol Vicks Vaporub Zinc lozenges Zyrtec   Constipation: Colace Ducolax suppositories Fleet enema Glycerin suppositories Metamucil Milk of magnesia Miralax Senokot Smooth move tea  Diarrhea: Kaopectate Imodium A-D  *NO pepto Bismol  Hemorrhoids: Anusol Anusol HC Preparation H Tucks  Indigestion: Tums Maalox Mylanta Zantac  Pepcid  Insomnia: Benadryl (alcohol free) 25mg every 6 hours as needed Tylenol PM Unisom, no Gelcaps  Leg Cramps: Tums MagGel  Nausea/Vomiting:  Bonine Dramamine Emetrol Ginger extract Sea bands Meclizine  Nausea medication to take during pregnancy:  Unisom (doxylamine succinate 25 mg tablets) Take one tablet daily at bedtime. If symptoms are not adequately controlled, the dose can be increased to a maximum recommended dose of two tablets daily (1/2 tablet in the morning, 1/2 tablet mid-afternoon and one at bedtime). Vitamin B6 100mg tablets. Take one tablet twice a day (up to 200 mg per day).  Skin Rashes: Aveeno products Benadryl cream or 25mg every 6 hours as needed Calamine Lotion 1% cortisone cream  Yeast infection: Gyne-lotrimin 7 Monistat 7   **If taking multiple medications, please check labels to avoid duplicating the same active ingredients **take medication as directed on   the label ** Do not exceed 4000 mg of tylenol in 24 hours **Do not take medications that contain aspirin or ibuprofen    

## 2022-10-27 NOTE — MAU Provider Note (Signed)
History     CSN: [id number]  Arrival date and time: 10/27/22 0939   None    Chief Complaint  Patient presents with   Abdominal Pain   Back Pain   Constipation   Knee Pain   HPI Sonya [name] is a 21 y.o. G1P0 at 82w4dby LMP who presents to MAU for abdominal pain and constipation. Patient reports she has not had a bowel movement in 4 weeks. She has been taking Miralax every day without any relief. She is passing a lot of gas which she reports is painful. Approximately 1 week ago, she reports her abdomen started hurting. Pain is sharp and shooting. Was previously taking Zofran, Phenergan, Robinul and Protonix but has not taken recently as nausea and vomiting have improved although has decreased appetite and is not eating or drinking much. She denies bleeding, discharge, or urinary s/s.   [facility]    OB History     Gravida  1   Para  0   Term  0   Preterm  0   AB  0   Living  0      SAB  0   IAB  0   Ectopic  0   Multiple  0   Live Births  0           Past Medical History:  Diagnosis Date   ADHD (attention deficit hyperactivity disorder)    no meds currently   Anxiety    History of seasonal allergies     Past Surgical History:  Procedure Laterality Date   CHOLECYSTECTOMY N/A 01/29/2018   Procedure: LAPAROSCOPIC CHOLECYSTECTOMY WITH INTRAOPERATIVE CHOLANGIOGRAM;  Surgeon: AStanford Scotland MD;  [facility]  Service: Pediatrics;  Laterality: N/A;    Family History  Problem Relation Age of Onset   Epilepsy Mother    Schizophrenia Father    Epilepsy Brother     Social History   Tobacco Use   Smoking status: Never   Smokeless tobacco: Never  Vaping Use   Vaping Use: Never used  Substance Use Topics   Alcohol use: Not Currently   Drug use: Not Currently    Types: Marijuana    Comment: last smoked 2 weeks ago (early October 2023)    Allergies: No Known Allergies  Medications Prior to  Admission  Medication Sig Dispense Refill Last Dose   ondansetron (ZOFRAN) 8 MG tablet Take 1 tablet (8 mg total) by mouth every 6 (six) hours as needed for nausea or vomiting. 30 tablet 3 Past Month   pantoprazole (PROTONIX) 20 MG tablet Take 1 tablet (20 mg total) by mouth daily. 30 tablet 6 Past Month   polyethylene glycol powder (GLYCOLAX/MIRALAX) 17 GM/SCOOP powder Take 17 g by mouth 2 (two) times daily as needed for moderate constipation. 500 g 2 10/26/2022   Blood Pressure Monitoring (BLOOD PRESSURE KIT) DEVI 1 Device by Does not apply route once a week. 1 each 0    glycopyrrolate (ROBINUL) 2 MG tablet Take 1 tablet (2 mg total) by mouth 3 (three) times daily as needed. 30 tablet 3    Prenatal 28-0.8 MG TABS Take 1 tablet by mouth daily. 30 tablet 12    Prenatal Vit-Fe Fumarate-FA (WESTAB PLUS) 27-1 MG TABS Take 1 tablet by mouth daily. (Patient not taking: Reported on 10/18/2022)      promethazine (PHENERGAN) 12.5 MG tablet Take 2 tablets (25 mg total) by mouth every 6 (six) hours as  needed for nausea or vomiting. 30 tablet 3    Review of Systems  Constitutional: Negative.   Respiratory: Negative.    Cardiovascular: Negative.   Gastrointestinal:  Positive for abdominal pain and constipation.  Genitourinary: Negative.   Neurological: Negative.    Physical Exam  Patient Vitals for the past 24 hrs:  BP Temp Pulse Resp SpO2  10/27/22 1005 130/67 (!) 97.5 F (36.4 C) (!) 110 15 99 %   Physical Exam Vitals and nursing note reviewed.  Constitutional:      General: She is not in acute distress. Cardiovascular:     Rate and Rhythm: Tachycardia present.  Pulmonary:     Effort: Pulmonary effort is normal.  Abdominal:     General: Bowel sounds are normal.     Palpations: Abdomen is soft.     Tenderness: There is generalized abdominal tenderness. There is no guarding or rebound. Negative signs include Murphy's sign.  Skin:    General: Skin is warm and dry.  Neurological:      General: No focal deficit present.     Mental Status: She is alert and oriented to person, place, and time.  Psychiatric:        Mood and Affect: Mood normal.        Behavior: Behavior normal.    US OB Comp Less 14 Wks  Result Date: 10/27/2022 CLINICAL DATA:  Abdominal pain.  Positive pregnancy test. EXAM: TWIN OBSTETRICAL ULTRASOUND <14 WKS TECHNIQUE: Transabdominal ultrasound was performed for evaluation of the gestation as well as the maternal uterus and adnexal regions. COMPARISON:  None Available. FINDINGS: Number of IUPs:  2 Chorionicity/Amnionicity:  Dichorionic/diamniotic TWIN 1 Yolk sac:  Visualized Embryo:  Visualized Cardiac Activity: Visualized Heart Rate: 170 bpm CRL:   36.9 mm   10 w for d                  [place] EDC: 05/21/2023 TWIN 2 Yolk sac:  Visualized Embryo:  Visualized Cardiac Activity: Visualized Heart Rate: 169 bpm CRL:   39.2 mm   10 w 6 d                  [place] EDC: 05/19/2023 Subchorionic hemorrhage:  None visualized. Maternal uterus/adnexae: Maternal ovaries unremarkable measuring 2.5 x 1.6 x 2.4 cm on the right and 4.4 x 2.1 x 2.8 cm on the left. No evidence for free fluid in the cul-de-sac. IMPRESSION: Twin living dichorionic/diamniotic intrauterine gestation without complicating features. Electronically Signed   By: Misty Stanley M.D.   On: 10/27/2022 12:01   US OB Comp AddL Gest Less 14 Wks  Result Date: 10/27/2022 CLINICAL DATA:  Abdominal pain.  Positive pregnancy test. EXAM: TWIN OBSTETRICAL ULTRASOUND <14 WKS TECHNIQUE: Transabdominal ultrasound was performed for evaluation of the gestation as well as the maternal uterus and adnexal regions. COMPARISON:  None Available. FINDINGS: Number of IUPs:  2 Chorionicity/Amnionicity:  Dichorionic/diamniotic TWIN 1 Yolk sac:  Visualized Embryo:  Visualized Cardiac Activity: Visualized Heart Rate: 170 bpm CRL:   36.9 mm   10 w for d                  [place] EDC: 05/21/2023 TWIN 2 Yolk sac:  Visualized Embryo:  Visualized Cardiac Activity:  Visualized Heart Rate: 169 bpm CRL:   39.2 mm   10 w 6 d                  [place] EDC: 05/19/2023 Subchorionic hemorrhage:  None visualized. Maternal  uterus/adnexae: Maternal ovaries unremarkable measuring 2.5 x 1.6 x 2.4 cm on the right and 4.4 x 2.1 x 2.8 cm on the left. No evidence for free fluid in the cul-de-sac. IMPRESSION: Twin living dichorionic/diamniotic intrauterine gestation without complicating features. Electronically Signed   By: Misty Stanley M.D.   On: 10/27/2022 12:01    MAU Course  Procedures  MDM UA [place] SMOG  UA with small amount of ketonuria, otherwise unremarkable. Ultrasound shows normal live intrauterine di/di twin pregnancy. Patient given smog enema with large amount of stool passed. After SMOG enema completed patient reports feeling significantly better. I reviewed with patient importance of fiber and water intake. May use fiber supplement to help with fiber intake. May also continue to use Miralax. Will send Colace to pharmacy. I also reviewed that if nausea and vomiting were to become an issue again, try to avoid Zofran if possible unless no other medications work as this can contribute to constipation. Patient verbalizes understanding of plan. At this time, patient is stable for discharge home.   Assessment and Plan  [redacted] weeks gestation of pregnancy Di/di twin pregnancy Abdominal pain affecting pregnancy Constipation  - Discharge home in stable condition - Rx for Colace sent - Return to MAU as needed for new/worsening symptoms - Keep NOB as scheduled on 12/7 at [facility], [place] 10/27/2022, 2:01 PM

## 2022-10-27 NOTE — MAU Note (Signed)
...  Sonya [name] is a 21 y.o. at [redacted]w[redacted]d here in MAU reporting: Has not had a bowel movement in one month. She reports she began having bilateral lower abdominal pain and lower back pain that began one week ago. She feels as if these pains are related to her lack of a bowel movement. She is also endorsing bilateral knee pain that began three days ago. She denies any recent strenuous activity, heavy lifting, or falls. Denies VB or LOF.   Has been taking Miralax every day for one week.  Onset of complaint: x1 week for lower abdomen and lower back pain. x1 month no BM.  Pain score:  10/10 bilateral lower abdomen 10/10 mid lower back  FHT: spontaneous Di/Di twins, unable to doppler. Lab orders placed from triage: UA

## 2022-10-30 ENCOUNTER — Other Ambulatory Visit: Payer: Self-pay

## 2022-10-30 ENCOUNTER — Encounter (HOSPITAL_COMMUNITY): Payer: Self-pay | Admitting: Obstetrics and Gynecology

## 2022-10-30 ENCOUNTER — Inpatient Hospital Stay (HOSPITAL_COMMUNITY)
Admission: AD | Admit: 2022-10-30 | Discharge: 2022-10-30 | Disposition: A | Discharge status: Home / Self Care | Payer: Medicaid Other | Attending: Obstetrics and Gynecology | Admitting: Obstetrics and Gynecology

## 2022-10-30 DIAGNOSIS — E860 Dehydration: Secondary | ICD-10-CM | POA: Insufficient documentation

## 2022-10-30 DIAGNOSIS — R079 Chest pain, unspecified: Secondary | ICD-10-CM | POA: Insufficient documentation

## 2022-10-30 DIAGNOSIS — O26891 Other specified pregnancy related conditions, first trimester: Secondary | ICD-10-CM | POA: Insufficient documentation

## 2022-10-30 DIAGNOSIS — O99282 Endocrine, nutritional and metabolic diseases complicating pregnancy, second trimester: Secondary | ICD-10-CM | POA: Insufficient documentation

## 2022-10-30 DIAGNOSIS — O211 Hyperemesis gravidarum with metabolic disturbance: Secondary | ICD-10-CM

## 2022-10-30 DIAGNOSIS — Z3A22 22 weeks gestation of pregnancy: Secondary | ICD-10-CM | POA: Insufficient documentation

## 2022-10-30 DIAGNOSIS — R059 Cough, unspecified: Secondary | ICD-10-CM | POA: Diagnosis not present

## 2022-10-30 DIAGNOSIS — Z3A11 11 weeks gestation of pregnancy: Secondary | ICD-10-CM | POA: Diagnosis not present

## 2022-10-30 DIAGNOSIS — O30041 Twin pregnancy, dichorionic/diamniotic, first trimester: Secondary | ICD-10-CM | POA: Diagnosis not present

## 2022-10-30 DIAGNOSIS — O210 Mild hyperemesis gravidarum: Secondary | ICD-10-CM | POA: Insufficient documentation

## 2022-10-30 DIAGNOSIS — R0602 Shortness of breath: Secondary | ICD-10-CM | POA: Diagnosis not present

## 2022-10-30 LAB — URINALYSIS, ROUTINE W REFLEX MICROSCOPIC
Bilirubin Urine: NEGATIVE
Glucose, UA: NEGATIVE mg/dL
Hgb urine dipstick: NEGATIVE
Ketones, ur: 80 mg/dL — AB
Leukocytes,Ua: NEGATIVE
Nitrite: NEGATIVE
Protein, ur: 100 mg/dL — AB
Specific Gravity, Urine: 1.029 (ref 1.005–1.030)
pH: 5 (ref 5.0–8.0)

## 2022-10-30 LAB — COMPREHENSIVE METABOLIC PANEL
ALT: 27 U/L (ref 0–44)
AST: 32 U/L (ref 15–41)
Albumin: 4.4 g/dL (ref 3.5–5.0)
Alkaline Phosphatase: 33 U/L — ABNORMAL LOW (ref 38–126)
Anion gap: 18 — ABNORMAL HIGH (ref 5–15)
BUN: 8 mg/dL (ref 6–20)
CO2: 18 mmol/L — ABNORMAL LOW (ref 22–32)
Calcium: 10.3 mg/dL (ref 8.9–10.3)
Chloride: 101 mmol/L (ref 98–111)
Creatinine, Ser: 0.78 mg/dL (ref 0.44–1.00)
GFR, Estimated: >60 mL/min (ref >60)
Glucose, Bld: 72 mg/dL (ref 70–99)
Potassium: 3.9 mmol/L (ref 3.5–5.1)
Sodium: 137 mmol/L (ref 135–145)
Total Bilirubin: 1.1 mg/dL (ref 0.3–1.2)
Total Protein: 7.5 g/dL (ref 6.5–8.1)

## 2022-10-30 LAB — CBC
HCT: 38.9 % (ref 36.0–46.0)
Hemoglobin: 13.6 g/dL (ref 12.0–15.0)
MCH: 30.2 pg (ref 26.0–34.0)
MCHC: 35 g/dL (ref 30.0–36.0)
MCV: 86.3 fL (ref 80.0–100.0)
Platelets: 264 10*3/uL (ref 150–400)
RBC: 4.51 MIL/uL (ref 3.87–5.11)
RDW: 11.9 % (ref 11.5–15.5)
WBC: 7.6 10*3/uL (ref 4.0–10.5)
nRBC: 0 % (ref 0.0–0.2)

## 2022-10-30 LAB — TROPONIN I (HIGH SENSITIVITY): Troponin I (High Sensitivity): <2 ng/L (ref <18)

## 2022-10-30 MED ORDER — LACTATED RINGERS IV BOLUS
1000.0000 mL | Freq: Once | INTRAVENOUS | Status: AC
  Administered 2022-10-30: 1000 mL via INTRAVENOUS

## 2022-10-30 MED ORDER — FAMOTIDINE IN NACL 20-0.9 MG/50ML-% IV SOLN
20.0000 mg | Freq: Once | INTRAVENOUS | Status: AC
  Administered 2022-10-30: 20 mg via INTRAVENOUS
  Filled 2022-10-30: qty 50

## 2022-10-30 MED ORDER — SODIUM CHLORIDE 0.9 % IV SOLN
25.0000 mg | Freq: Once | INTRAVENOUS | Status: AC
  Administered 2022-10-30: 25 mg via INTRAVENOUS
  Filled 2022-10-30: qty 1

## 2022-10-30 MED ORDER — SCOPOLAMINE 1 MG/3DAYS TD PT72
1.0000 | MEDICATED_PATCH | Freq: Once | TRANSDERMAL | Status: DC
  Administered 2022-10-30: 1.5 mg via TRANSDERMAL
  Filled 2022-10-30: qty 1

## 2022-10-30 MED ORDER — DEXTROSE 5 % IN LACTATED RINGERS IV BOLUS
1000.0000 mL | Freq: Once | INTRAVENOUS | Status: AC
  Administered 2022-10-30: 1000 mL via INTRAVENOUS

## 2022-10-30 MED ORDER — SCOPOLAMINE 1 MG/3DAYS TD PT72: 1.0000 | MEDICATED_PATCH | TRANSDERMAL | 1 refills | Status: DC

## 2022-10-30 NOTE — MAU Provider Note (Signed)
History     [id number]  Arrival date and time: 10/30/22 1242    Chief Complaint  Patient presents with   Headache   Chest Pain   Morning Sickness     HPI Sonya [name] is a 21 y.o. at 72w0dwho presents for n/v, chest pain, & headache. Has had n/v throughout pregnancy. Reports chest tightening and headache for the last few days. Reports occasional mid chest burning & constant tightness. Symptoms worse with swallowing. Symptoms make her feel SOB at times. Reports non productive cough since CP started. Has not taken antiemetic since last week b/c she ran out of meds & didn't realize she had a refill. Took protonix the other day but hasn't continued it. Denies fever, sore throat, abdominal pain, vaginal bleeding, or diarrhea. No hx of asthma. Hasn't treated symptoms.   OB History     Gravida  1   Para  0   Term  0   Preterm  0   AB  0   Living  0      SAB  0   IAB  0   Ectopic  0   Multiple  0   Live Births  0           Past Medical History:  Diagnosis Date   ADHD (attention deficit hyperactivity disorder)    no meds currently   Anxiety    History of seasonal allergies     Past Surgical History:  Procedure Laterality Date   CHOLECYSTECTOMY N/A 01/29/2018   Procedure: LAPAROSCOPIC CHOLECYSTECTOMY WITH INTRAOPERATIVE CHOLANGIOGRAM;  Surgeon: AStanford Scotland MD;  [place] [facility]  Service: Pediatrics;  Laterality: N/A;    Family History  Problem Relation Age of Onset   Epilepsy Mother    Schizophrenia Father    Epilepsy Brother     No Known Allergies  No current facility-administered medications on file prior to encounter.   Current Outpatient Medications on File Prior to Encounter  Medication Sig Dispense Refill   pantoprazole (PROTONIX) 20 MG tablet Take 1 tablet (20 mg total) by mouth daily. 30 tablet 6   promethazine (PHENERGAN) 12.5 MG tablet Take 2 tablets (25 mg total) by mouth every 6 (six) hours as needed for nausea or vomiting. 30  tablet 3   Blood Pressure Monitoring (BLOOD PRESSURE KIT) DEVI 1 Device by Does not apply route once a week. 1 each 0   docusate sodium (COLACE) 250 MG capsule Take 1 capsule (250 mg total) by mouth daily. 10 capsule 0   glycopyrrolate (ROBINUL) 2 MG tablet Take 1 tablet (2 mg total) by mouth 3 (three) times daily as needed. 30 tablet 3   ondansetron (ZOFRAN) 8 MG tablet Take 1 tablet (8 mg total) by mouth every 6 (six) hours as needed for nausea or vomiting. 30 tablet 3   polyethylene glycol powder (GLYCOLAX/MIRALAX) 17 GM/SCOOP powder Take 17 g by mouth 2 (two) times daily as needed for moderate constipation. 500 g 2   Prenatal 28-0.8 MG TABS Take 1 tablet by mouth daily. 30 tablet 12     ROS Pertinent positives and negative per HPI, all others reviewed and negative  Physical Exam   BP 110/72 (BP Location: Right Arm)   Pulse (!) 107   Temp 98.2 F (36.8 C) (Oral)   Resp 16   Ht _0  (1.676 m)   Wt 57.3 kg   LMP 08/14/2022   SpO2 99% Comment: room air  BMI 20.39 kg/m   Patient Vitals  for the past 24 hrs:  BP Temp Temp src Pulse Resp SpO2 Height Weight  10/30/22 1306 110/72 98.2 F (36.8 C) Oral (!) 107 16 99 % -- --  10/30/22 1303 -- -- -- -- -- -- _0  (1.676 m) 57.3 kg    Physical Exam Vitals and nursing note reviewed.  Constitutional:      General: She is not in acute distress.    Appearance: She is well-developed.  HENT:     Head: Normocephalic and atraumatic.  Cardiovascular:     Rate and Rhythm: Normal rate and regular rhythm.     Heart sounds: Normal heart sounds.  Pulmonary:     Effort: Pulmonary effort is normal. No respiratory distress.     Breath sounds: Normal breath sounds.  Abdominal:     Palpations: Abdomen is soft.     Tenderness: There is no abdominal tenderness.  Skin:    General: Skin is warm and dry.     Capillary Refill: Capillary refill takes less than 2 seconds.  Neurological:     Mental Status: She is alert.      Bedside  Ultrasound Pt informed that the ultrasound is considered a limited OB ultrasound and is not intended to be a complete ultrasound exam.  Patient also informed that the ultrasound is not being completed with the intent of assessing for fetal or placental anomalies or any pelvic abnormalities.  Explained that the purpose of today's ultrasound is to assess for  viability.  Patient acknowledges the purpose of the exam and the limitations of the study.     My interpretation: Kathi Der twins. A FHR 160 bpm, B FHR 172 bpm   Labs Results for orders placed or performed during the hospital encounter of 10/30/22 (from the past 24 hour(s))  Urinalysis, Routine w reflex microscopic Urine, Clean Catch     Status: Abnormal   Collection Time: 10/30/22  1:14 PM  Result Value Ref Range   Color, Urine YELLOW YELLOW   APPearance HAZY (A) CLEAR   Specific Gravity, Urine 1.029 1.005 - 1.030   pH 5.0 5.0 - 8.0   Glucose, UA NEGATIVE NEGATIVE mg/dL   Hgb urine dipstick NEGATIVE NEGATIVE   Bilirubin Urine NEGATIVE NEGATIVE   Ketones, ur 80 (A) NEGATIVE mg/dL   Protein, ur 100 (A) NEGATIVE mg/dL   Nitrite NEGATIVE NEGATIVE   Leukocytes,Ua NEGATIVE NEGATIVE   RBC / HPF 0-5 0 - 5 RBC/hpf   WBC, UA 0-5 0 - 5 WBC/hpf   Bacteria, UA RARE (A) NONE SEEN   Squamous Epithelial / LPF 0-5 0 - 5   Mucus PRESENT   CBC     Status: None   Collection Time: 10/30/22  2:17 PM  Result Value Ref Range   WBC 7.6 4.0 - 10.5 K/uL   RBC 4.51 3.87 - 5.11 MIL/uL   Hemoglobin 13.6 12.0 - 15.0 g/dL   HCT 38.9 36.0 - 46.0 %   MCV 86.3 80.0 - 100.0 fL   MCH 30.2 26.0 - 34.0 pg   MCHC 35.0 30.0 - 36.0 g/dL   RDW 11.9 11.5 - 15.5 %   Platelets 264 150 - 400 K/uL   nRBC 0.0 0.0 - 0.2 %  Comprehensive metabolic panel     Status: Abnormal   Collection Time: 10/30/22  2:17 PM  Result Value Ref Range   Sodium 137 135 - 145 mmol/L   Potassium 3.9 3.5 - 5.1 mmol/L   Chloride 101 98 - 111 mmol/L  CO2 18 (L) 22 - 32 mmol/L   Glucose, Bld  72 70 - 99 mg/dL   BUN 8 6 - 20 mg/dL   Creatinine, Ser 0.78 0.44 - 1.00 mg/dL   Calcium 10.3 8.9 - 10.3 mg/dL   Total Protein 7.5 6.5 - 8.1 g/dL   Albumin 4.4 3.5 - 5.0 g/dL   AST 32 15 - 41 U/L   ALT 27 0 - 44 U/L   Alkaline Phosphatase 33 (L) 38 - 126 U/L   Total Bilirubin 1.1 0.3 - 1.2 mg/dL   GFR, Estimated >60 >60 mL/min   Anion gap 18 (H) 5 - 15  Troponin I (High Sensitivity)     Status: None   Collection Time: 10/30/22  2:17 PM  Result Value Ref Range   Troponin I (High Sensitivity) <2 <18 ng/L    Imaging No results found.  MAU Course  Procedures Lab Orders         Urinalysis, Routine w reflex microscopic Urine, Clean Catch         CBC         Comprehensive metabolic panel    Meds ordered this encounter  Medications   lactated ringers bolus 1,000 mL   famotidine (PEPCID) IVPB 20 mg premix   promethazine (PHENERGAN) 25 mg in sodium chloride 0.9 % 50 mL IVPB   scopolamine (TRANSDERM-SCOP) 1 MG/3DAYS 1.5 mg   lactated ringers bolus 1,000 mL   dextrose 5% lactated ringers bolus 1,000 mL   scopolamine (TRANSDERM-SCOP) 1 MG/3DAYS    Sig: Place 1 patch (1.5 mg total) onto the skin every 3 (three) days.    Dispense:  10 patch    Refill:  1    Order Specific Question:   Supervising Provider    Answer:   CONSTANT, [place] [[id number]]   Imaging Orders  No imaging studies ordered today    MDM EKG & labs reviewed with Dr. Nehemiah Settle. Will give D5LR.   EKG & troponin normal. Symptoms resolved with IV fluids & pepcid. Symptoms likely related to HEG & gerd.  Assessment and Plan   1. Hyperemesis gravidarum before end of [redacted] week gestation with dehydration   2. Dichorionic diamniotic twin pregnancy in first trimester   3. [redacted] weeks gestation of pregnancy    -Patient encouraged to refill phenergan (has 3 refills available) & take on schedule -Restart protonix daily -Rx scopolamine patch -Reviewed reasons to return to [place], NP 10/30/22 5:14 PM

## 2022-10-30 NOTE — MAU Note (Signed)
Sonya [name] is a 21 y.o. at [redacted]w[redacted]d here in MAU reporting: for 2 days has been having tight chest pain which makes her SOB. Also states her stomach feels tight when she gags and she has no energy. Also has a headache and feels dizzy.  Onset of complaint: ongoing  Pain score: chest 10/10, head 10/10  Vitals:   10/30/22 1306  BP: 110/72  Pulse: (!) 107  Resp: 16  Temp: 98.2 F (36.8 C)  SpO2: 99%     DZH:[id number]  Lab orders placed from triage: UA

## 2022-11-20 ENCOUNTER — Telehealth: Payer: Self-pay

## 2022-11-22 ENCOUNTER — Other Ambulatory Visit (HOSPITAL_COMMUNITY)
Admission: RE | Admit: 2022-11-22 | Discharge: 2022-11-22 | Disposition: A | Discharge status: Home / Self Care | Payer: Medicaid Other | Source: Ambulatory Visit | Attending: Advanced Practice Midwife | Admitting: Advanced Practice Midwife

## 2022-11-22 ENCOUNTER — Ambulatory Visit (INDEPENDENT_AMBULATORY_CARE_PROVIDER_SITE_OTHER): Payer: Medicaid Other | Admitting: Student

## 2022-11-22 ENCOUNTER — Encounter: Payer: Self-pay | Admitting: Student

## 2022-11-22 VITALS — BP 118/73 | HR 95 | Wt 128.4 lb

## 2022-11-22 DIAGNOSIS — O30042 Twin pregnancy, dichorionic/diamniotic, second trimester: Secondary | ICD-10-CM

## 2022-11-22 DIAGNOSIS — Z3A14 14 weeks gestation of pregnancy: Secondary | ICD-10-CM

## 2022-11-22 DIAGNOSIS — Z3480 Encounter for supervision of other normal pregnancy, unspecified trimester: Secondary | ICD-10-CM | POA: Diagnosis present

## 2022-11-22 DIAGNOSIS — Z1332 Encounter for screening for maternal depression: Secondary | ICD-10-CM | POA: Diagnosis not present

## 2022-11-22 DIAGNOSIS — Z3482 Encounter for supervision of other normal pregnancy, second trimester: Secondary | ICD-10-CM

## 2022-11-22 MED ORDER — ASPIRIN 81 MG PO TBEC: 81.0000 mg | DELAYED_RELEASE_TABLET | Freq: Every day | ORAL | 2 refills | Status: DC

## 2022-11-22 NOTE — Progress Notes (Signed)
History:   Sonya [name] is a 21 y.o. G1P0000 at 40w2dby LMP being seen today for her first obstetrical visit.  Her obstetrical history is significant for  nothing at this time . Patient does intend to breast feed. Pregnancy history fully reviewed.  Patient reports no complaints. Was experiencing a lot of n/v in first trimester but is feeling much better now.       HISTORY: OB History  Gravida Para Term Preterm AB Living  1 0 0 0 0 0  SAB IAB Ectopic Multiple Live Births  0 0 0 0 0    # Outcome Date GA Lbr Len/2nd Weight Sex Delivery Anes PTL Lv  1 Current             Last pap smear: not indicated   Past Medical History:  Diagnosis Date   ADHD (attention deficit hyperactivity disorder)    no meds currently   Anxiety    History of seasonal allergies    Past Surgical History:  Procedure Laterality Date   CHOLECYSTECTOMY N/A 01/29/2018   Procedure: LAPAROSCOPIC CHOLECYSTECTOMY WITH INTRAOPERATIVE CHOLANGIOGRAM;  Surgeon: AStanford Scotland MD;  [place] [facility]  Service: Pediatrics;  Laterality: N/A;   Family History  Problem Relation Age of Onset   Epilepsy Mother    Schizophrenia Father    Epilepsy Brother    Social History   Tobacco Use   Smoking status: Never   Smokeless tobacco: Never  Vaping Use   Vaping Use: Never used  Substance Use Topics   Alcohol use: Not Currently   Drug use: Not Currently    Types: Marijuana    Comment: last smoked 2 weeks ago (early October 2023)   No Known Allergies Current Outpatient Medications on File Prior to Visit  Medication Sig Dispense Refill   Prenatal 28-0.8 MG TABS Take 1 tablet by mouth daily. 30 tablet 12   scopolamine (TRANSDERM-SCOP) 1 MG/3DAYS Place 1 patch (1.5 mg total) onto the skin every 3 (three) days. 10 patch 1   Blood Pressure Monitoring (BLOOD PRESSURE KIT) DEVI 1 Device by Does not apply route once a week. 1 each 0   docusate sodium (COLACE) 250 MG capsule Take 1 capsule (250 mg total) by mouth daily.  10 capsule 0   glycopyrrolate (ROBINUL) 2 MG tablet Take 1 tablet (2 mg total) by mouth 3 (three) times daily as needed. 30 tablet 3   pantoprazole (PROTONIX) 20 MG tablet Take 1 tablet (20 mg total) by mouth daily. 30 tablet 6   polyethylene glycol powder (GLYCOLAX/MIRALAX) 17 GM/SCOOP powder Take 17 g by mouth 2 (two) times daily as needed for moderate constipation. 500 g 2   promethazine (PHENERGAN) 12.5 MG tablet Take 2 tablets (25 mg total) by mouth every 6 (six) hours as needed for nausea or vomiting. 30 tablet 3   No current facility-administered medications on file prior to visit.   Indications for ASA therapy (per uptodate) One of the following: Previous pregnancy with preeclampsia, especially early onset and with an adverse outcome No Multifetal gestation Yes Chronic hypertension No Type 1 or 2 diabetes mellitus No Chronic kidney disease No Autoimmune disease (antiphospholipid syndrome, systemic lupus erythematosus) No  Two or more of the following: Nulliparity Yes Obesity (body mass index >30 kg/m2) No Family history of preeclampsia in mother or sister No Age ?35 years Yes Sociodemographic characteristics (African American race, low socioeconomic level) Yes Personal risk factors (eg, previous pregnancy with low birth weight or small for gestational  age infant, previous adverse pregnancy outcome [eg, stillbirth], interval >10 years between pregnancies) No  Indications for early GDM screening  First-degree relative with diabetes No BMI >30kg/m2 No Age > 35 No Previous birth of an infant weighing ?4000 g No Gestational diabetes mellitus in a previous pregnancy No Glycated hemoglobin ?5.7 percent (39 mmol/mol), impaired glucose tolerance, or impaired fasting glucose on previous testing No High-risk race/ethnicity (eg, African American, Latino, Native American, [place] American, Pacific Islander) Yes Previous stillbirth of unknown cause No Maternal birthweight > 9 lbs  No History of cardiovascular disease No Hypertension or on therapy for hypertension No High-density lipoprotein cholesterol level <35 mg/dL (0.90 mmol/L) and/or a triglyceride level >250 mg/dL (2.82 mmol/L) No Polycystic ovary syndrome No Physical inactivity No Other clinical condition associated with insulin resistance (eg, severe obesity, acanthosis nigricans) No Current use of glucocorticoids No   Early screening tests: FBS, A1C, Random CBG, glucose challenge  Review of Systems Pertinent items noted in HPI and remainder of comprehensive ROS otherwise negative. Physical Exam:   Vitals:   11/22/22 0822  BP: 118/73  Pulse: 95  Weight: 128 lb 6.4 oz (58.2 kg)   Fetal Heart Rate (bpm): A: 150, B: 158 Uterus:     Pelvic Exam: Perineum: deferred   Vulva: deferred   Vagina:  deferred   Cervix: deferred   Adnexa: deferred   Bony Pelvis: average  System: General: well-developed, well-nourished female in no acute distress   Breasts:  normal appearance, no masses or tenderness bilaterally   Skin: normal coloration and turgor, no rashes   Neurologic: oriented, normal, negative, normal mood   Extremities: normal strength, tone, and muscle mass, ROM of all joints is normal   HEENT PERRLA, extraocular movement intact and sclera clear, anicteric   Mouth/Teeth mucous membranes moist, pharynx normal without lesions and dental hygiene good   Neck supple and no masses   Cardiovascular: regular rate and rhythm   Respiratory:  no respiratory distress, normal breath sounds   Abdomen: soft, non-tender; bowel sounds normal; no masses,  no organomegaly      Assessment:    Pregnancy: G1P0000 Patient Active Problem List   Diagnosis Date Noted   Supervision of other normal pregnancy, antepartum 10/18/2022   MDD (major depressive disorder), recurrent episode, severe ([place]) 10/20/2020   Cholelithiasis 12/30/2017     Plan:    1. Supervision of other normal pregnancy, antepartum -  CBC/D/Plt+RPR+Rh+ABO+RubIgG... - Cervicovaginal ancillary only - Culture, OB Urine - [facility] - PANORAMA PRENATAL TEST FULL PANEL  2. [redacted] weeks gestation of pregnancy - Continue routine follow-up  3. Dichorionic diamniotic twin pregnancy in second trimester   Initial labs drawn. Continue prenatal vitamins. Genetic Screening discussed, First trimester screen, Quad screen, and NIPS: ordered. Ultrasound discussed; fetal anatomic survey:  scheduled . Problem list reviewed and updated. The nature of [facility] with multiple MDs and other Advanced Practice Providers was explained to patient; also emphasized that residents, students are part of our team. Routine obstetric precautions reviewed. Return in about 4 weeks (around 12/20/2022) for IN-PERSON, W/MD ._0    Johnston Ebbs, [place] for [employer], [place]

## 2022-11-22 NOTE — Progress Notes (Signed)
Patient presents for new OB visit. No concerns at this time.  

## 2022-11-22 NOTE — Patient Instructions (Signed)
Safe Medications in Pregnancy   Acne: Benzoyl Peroxide Salicylic Acid  Backache/Headache: Tylenol: 2 regular strength every 4 hours OR              2 Extra strength every 6 hours  Colds/Coughs/Allergies: Benadryl (alcohol free) 25 mg every 6 hours as needed Breath right strips Claritin Cepacol throat lozenges Chloraseptic throat spray Cold-Eeze- up to three times per day Cough drops, alcohol free Flonase (by prescription only) Guaifenesin Mucinex Robitussin DM (plain only, alcohol free) Saline nasal spray/drops Sudafed (pseudoephedrine) & Actifed ** use only after [redacted] weeks gestation and if you do not have high blood pressure Tylenol Vicks Vaporub Zinc lozenges Zyrtec   Constipation: Colace Ducolax suppositories Fleet enema Glycerin suppositories Metamucil Milk of magnesia Miralax Senokot Smooth move tea  Diarrhea: Kaopectate Imodium A-D  *NO pepto Bismol  Hemorrhoids: Anusol Anusol HC Preparation H Tucks  Indigestion: Tums Maalox Mylanta Zantac  Pepcid  Insomnia: Benadryl (alcohol free) 25mg every 6 hours as needed Tylenol PM Unisom, no Gelcaps  Leg Cramps: Tums MagGel  Nausea/Vomiting:  Bonine Dramamine Emetrol Ginger extract Sea bands Meclizine  Nausea medication to take during pregnancy:  Unisom (doxylamine succinate 25 mg tablets) Take one tablet daily at bedtime. If symptoms are not adequately controlled, the dose can be increased to a maximum recommended dose of two tablets daily (1/2 tablet in the morning, 1/2 tablet mid-afternoon and one at bedtime). Vitamin B6 100mg tablets. Take one tablet twice a day (up to 200 mg per day).  Skin Rashes: Aveeno products Benadryl cream or 25mg every 6 hours as needed Calamine Lotion 1% cortisone cream  Yeast infection: Gyne-lotrimin 7 Monistat 7   **If taking multiple medications, please check labels to avoid duplicating the same active ingredients **take medication as directed on  the label ** Do not exceed 4000 mg of tylenol in 24 hours **Do not take medications that contain aspirin or ibuprofen      AREA PEDIATRIC/FAMILY PRACTICE PHYSICIANS  [place] ([address])  [facility] Chambliss, MD; Eniola, MD; Hale, MD; Hensel, MD; McDiarmid, MD; McIntyer, MD; Neal, MD; Walden, MD [address] [place], [place] [address] [phone] Mon-Fri 8:30-12:30, 1:30-5:00 Providers come to see babies at [facility] Accepting Medicaid [facility] providers who accept newborns: Koirala, MD; Morrow, MD; Wolters, MD [address], [place], [place] [address] [phone] Mon-Fri 8:00-5:30 Babies seen by providers at [facility] Does NOT accept Medicaid Please call early in hospitalization for appointment (limited availability)  [facility] Mulberry, MD [address] [place], [place] [address] [phone] Mon, Tue, Thur, Fri 8:30-5:00, Wed 10:00-7:00 (closed 1-2pm) Babies seen by [facility] providers Accepting Medicaid Rubin - Pediatrician Rubin, MD [address], [place], [place] [address] [phone] Mon-Fri 8:30-5:00, Sat 8:30-12:00 Provider comes to see babies at [facility] Accepting Medicaid Must have been referred from current patients or contacted office prior to delivery [facility] ([facility]) Brown, MD; Chandler, MD; Ettefagh, MD; Grant, MD; Lester, MD; McCormick, MD; McQueen, MD; Prose, MD; Simha, MD; Stanley, MD; Stryffeler, NP; Tebben, NP [address], [place], [place] [address] [phone] Mon, Tue, Thur, Fri 8:30-5:30, Wed 9:30-5:30, Sat 8:30-12:30 Babies seen by [facility] providers Accepting Medicaid Only accepting infants of first-time parents or siblings of current patients Hospital discharge coordinator will make  follow-up appointment Jack Amos [address], , [place]  [address] [phone]     Fax - [phone] [facility] [address], [place], [place]  [address] Phone - [phone]   Fax - [phone] Shilpa Gosrani [address], [place], [place]  [address] [phone]  [place] ([address]) [facility] Bates, MD; Brassfield, MD; Cooper, Cox, MD; MD; Davis, MD; Dovico, MD; Ettefaugh, MD; Little, MD; Lowe, MD; Keiffer, MD; Melvin, MD; Sumner, MD; Williams, MD [address], [place], [place] [address] [phone] Mon-Fri 8:30-5:00 (extended evenings Mon-Thur as needed), Sat-Sun 10:00-1:00 Providers come to see babies at [facility] Accepting Medicaid for families of first-time babies and families with all children in the household age 3 and under. Must register with office prior to making appointment (M-F only). [facility] Henson, NP; Knapp, MD; Lalonde, MD; Tysinger, PA [address] [place], [place] [address] [phone] Mon-Fri 8:00-5:00 Babies seen by providers at [facility] Does NOT accept Medicaid/Commercial Insurance Only [facility] ([facility])  Artis, MD; Barnes, MD; Bratton, MD; Coccaro, MD; [place] Gardner, MD; Kramer, MD; Marshall, MD; Netherton, MD; Poleto, MD; Skinner, MD [address] [place], [place] [address] [phone] Mon-Fri 8:30-5:30, Sat (Oct.-Mar.) 9:00-1:00 Babies seen by providers at [facility] Accepting Medicaid  [place] ([address]) [facility] Reid, MD; Warner, MD [address], [place], [place] [address] [phone] Mon-Fri 8:30-5:00, Sat 8:30-12:00 Providers come to see babies at [facility] Does NOT accept Medicaid [facility] Becker, PA; Hagler, MD; Scifres, PA; Sun, MD; Swayne, MD [address], [place], [place]  [address] [phone] Mon-Fri 8:00-5:00 Babies seen by providers at [facility] Does NOT accept Medicaid Only accepting babies of parents who are patients Please call early in hospitalization for appointment (limited availability) [facility] Clark, MD; Frye, MD; Kelleher, MD; Mack, NP; Miller, MD; O'Keller, MD; Patterson, NP; Pudlo, MD; Puzio, MD; Thomas, MD; Tucker, MD; Twiselton, MD [address], [place], [place] [address] [phone] Mon-Fri 8:00-5:00, Sat 9:00-12:00 Providers come to see babies at [facility] Does NOT accept Medicaid  [place] ([address]) [facility] providers accepting new patients: Brake, NP; Wharton, PA [address], [place], [place] [address] [phone] Mon-Fri 8:00-5:00 Babies seen by providers at [facility] Does NOT accept Medicaid Only accepting babies of parents who are patients Please call early in hospitalization for appointment (limited availability) [facility] Gay, MD; Quinlan, MD [address] [place], [place] [address] [phone] (press 1 to schedule appointment) Mon-Fri 8:00-5:00 Providers come to see babies at [facility] Does NOT accept Medicaid [facility] Mazer, MD [address] [place], [place] [address] [phone] Mon-Fri 8:30-5:00 (lunch 12:30-1:00), extended hours by appointment only Wed 5:00-6:30 Babies seen by [facility] providers Accepting Medicaid [facility] Banks, MD; Jordan, MD; Koberlein, MD [address], [place], [place] [address] [phone] Mon-Fri 8:00-5:00 Babies seen by [facility] providers Does NOT accept Medicaid [facility] Parker, MD; Hunter, MD; Wallace, DO [address] [place], [place] [address] [phone] Mon-Fri 8:00-5:00 Babies seen by [facility] providers Does NOT accept Medicaid [facility] Brandon, PA; Brecken, PA; Christy, NP; Dees, MD; DeClaire, MD; DeWeese, MD; Hansen, NP; Mills, NP; Parrish, NP; Smoot, NP; Summer, MD; Vapne, MD [address] , [place] [address] [phone] Mon-Fri 8:30-5:00, Sat 10:00-1:00 Providers come to see babies at [facility] Does NOT accept Medicaid Free prenatal information session Tuesdays at 4:45pm [facility] Bouska, MD; Gordon, PA; Jeffery, PA; Weber, PA [address] [place] [address] [phone] Mon-Fri 7:30-5:30 Babies seen by [facility] providers [facility] [address], [place], [place]  [address] [phone]   Fax - [phone]  [place] ([address] & [address]) [facility] [place] [address] [place], [place] [address] [phone] Mon-Thur 8:00-6:00 Providers come to see babies at [facility] Accepting Medicaid [facility] [place], NP; Badger, MD; Beal, PA; Spencer, PA [address] [place], [place] [address] [phone] Mon-Thur 7:30-7:30, Fri 7:30-4:30 Babies seen by [facility] providers Accepting Medicaid [facility] Agbuya, MD; Klett, NP; Romgoolam, MD [address], [place], [place] [address] [phone] Mon-Fri 8:30-5:00, Sat 8:30-12:00 Providers come to see babies at [facility] Accepting Medicaid Must have "Meet & Greet" appointment at office prior to delivery [facility] McCord, MD; Wallace, MD; Wood, MD [address], [place], [place] [address] [phone] Mon-Wed 8:00-6:00, Thur-Fri 8:00-5:00, Sat 9:00-12:00 Providers come to see babies at [facility] Does NOT accept Medicaid Only accepting siblings of current patients [facility]  [address], [place], [place]  [address] [phone]   Fax  - [phone] [facility] [address], [place], [place]  [address] [phone]   Fax - [phone]  [place]  ([address] & [address]) [facility] Cirigliano, DO; Matthews, DO [address] , [place] [address] [phone] Mon-Fri 7:00-5:00 Babies seen by [facility] providers Does NOT accept Medicaid [facility] Briscoe, MD; Howley, PA; Moreira, PA [address], [place], [place] [address] [phone] Mon-Fri 8:00-5:00 Babies seen by [facility] providers Accepting Medicaid [facility] Boyd, MD; Church, PA; Jones, NP; Osborn, PA [address], , [place] [address] [phone] Mon-Fri 8:00-5:00 Babies seen by providers at [facility] Accepting Medicaid  [place] ([address])  [facility] [place] Wendling, DO [address] [place], [place] [address] [phone] Mon-Fri 8:00-5:00 Babies seen by [facility] providers Does NOT accept Medicaid Limited availability, please call early in hospitalization to schedule follow-up [facility] Calderon, PA; Cummings, MD; Dillard, MD; Martin, PA; Olson, MD; VanDeven, PA [address], [place], [place] [address] [phone] Mon-Fri 8:30-5:00, Sat 9:00-12:00 Babies seen by providers at [facility] Accepting Medicaid Please register online then schedule online or call office [email or web address]) Hunter, NP; Kumar, MD; Martin Rogers, PA [address], [place], [place] [address] [phone] Mon-Fri 8:00-5:00 Babies seen by providers at [facility] Accepting Medicaid [facility] ([facility]) [place], MD; Kristi Fleenor, NP; West, MD [address], [place], [place] [address] [phone] Mon-Fri 8:00-5:30, Sat&Sun by appointment (phones open at 8:30) Babies seen by [facility] providers Accepting Medicaid Must be a first-time baby or sibling of current patient [facility] [place]  [address], [place], [place]  [address] [phone]   Fax - [phone]  [place] ([address]) [facility] Brown, PA; Cowen, PA; Rice, MD; Helton, PA; Spry, MD [address] [place], [place] [address] [phone] Mon-Thur 8:00-7:00, Fri 8:00-5:00, Sat 8:00-12:00, Sun 9:00-12:00 Babies seen by [facility] providers Accepting Medicaid [facility] [place] Coe-Goins, MD; Marshall, MD; Pierre-Louis, MD [address], [place], [place] [address] [phone] Mon-Thur 8:00-5:00 Babies seen by providers at [facility] Accepting Medicaid [facility] Bratton, MD; Coe-Goins, MD; Hayes, MD; Lewis, MD; List, MD; Lott, MD; Marshall, MD; Moran, MD; O'Neal, MD; Pierre-Louis, MD; Pitonzo, MD; Scholer, MD; Spangle, MD [address] [place], [place] [address] [phone] Mon-Fri 8:00-5:30, Sat (Oct.-Mar.) 9:00-1:00 Babies seen by providers at [facility] Accepting Medicaid Must fill out new patient packet, available online at [email or web address] [facility] ([facility]) Friddle, NP; Harris, NP; Kelly, NP; [place], MD; Melvin, PA; Poth, MD; Ramadoss, MD; Stanton, NP [address], [place], [place] [address] [phone] Mon-Thur 8:00-5:30, Fri 8:00-5:00 Babies seen by providers at [facility] Accepting Medicaid  [place] ([address]) [facility] Dixon, PA; [place], MD; Pickard, MD; Tapia, PA [address], [place], [place] [address] [phone] Mon-Fri  8:00-5:00 Babies seen by providers at [facility] Accepting Medicaid   [place] ([address]) [facility] [place] Masneri, DO; Meyers, MD; Nelson, PA [address], [place], [place] [address] [phone] Mon-Fri 8:00-5:00 Babies seen by providers at [facility] Does NOT accept Medicaid Limited appointment availability, please call early in hospitalization  [facility] [place] Kunedd, DO; McGowen, MD [address], [place], [place] [address] [phone] Mon-Fri 8:00-5:00 Babies seen by [facility] providers Does NOT accept Medicaid [facility] [place] Cameron, MD; MacDonald, MD; Michaels, PA; Nayak, MD [address] [place] [address], [place], [place] [address] [phone] Mon-Fri 8:00-5:00 After hours clinic ([address] [place], [place] [address]) [phone] Mon-Fri 5:00-8:00, Sat 12:00-6:00, Sun 10:00-4:00 Babies seen by [facility] providers Accepting Medicaid [facility] [place] [address], [place], [place]  [address] [phone]   Fax - [phone]  [place] ([address]) [facility] [place] [facility] Andy, MD [address], [place], [place] [address] [phone] Mon-Fri 8:00-5:00 Babies seen by [facility] providers Does NOT accept Medicaid [facility] [place] ([facility] [place]) Eksir, MD [address], [place], [place] [address] [phone] Mon-Thur 8:00-7:00, Fri 8:00-5:00, Sat 8:00-12:00 Babies seen by providers at [facility] Accepting Medicaid - but does not have vaccinations in office (must be received elsewhere) Limited availability, please call early in hospitalization  [place] ([address]) [facility]  Charlene Flemming, MD [address], [place] [address] [phone]  Fax [phone]  [facility]   Kimberly [place], MD, Annamarie Streilein, PA, Carla Hampton, PA [address] [place] [place], [place] [address] [phone] [facility]  [address], [place], [place] [address] [phone] [address], [place], [place] [address] [phone] (West Office)  [facility] [address], [place], [place] [address] [phone] [facility] [address] [place], [place], [place] [address] [phone] [facility] [address], [place], [place] [address] [phone] [facility] [address], [place], [place] [address] [phone] [facility] [address], [place], [place] [address] [phone] [facility] [address], [place], [place] [address] [phone] [facility]  [address], [place], [place] [address] [phone] Dr[facility] [address] [place] [address], ,  [address] [phone] [facility] [address], [address]   [place], [place] [address] [phone] [facility] [address], [place], [place] [address] [phone]   We highly recommend childbirth education to help you plan for labor and begin practicing coping skills (which will be needed with or without pain meds).  [place] Childbirth Education Options: Sign up by visiting [email or web address]  Childbirth ~ Self-Paced eClass (English and Spanish) This online class offers you the freedom to complete a childbirth education series in the comfort of your own home at your own pace.  Childbirth Class (In-Person 4-Week Series  or on Saturdays, Virtual 4-Week Series ~ [place]) This interactive in-person class series will help you and your partner prepare for your birth experience. Topics include: Labor & Birth, Comfort Measures, Breathing Techniques, Massage, Medical Interventions, Pain Management Options, Cesarean Birth, Postpartum Care, and Newborn  Care  Comfort Techniques for Labor ~ In-Person Class [facility]) This interactive class is designed for parents-to-be who want to learn & practice hands-on skills to help relieve some of the discomfort of labor and encourage their babies to rotate toward the best position for birth. Moms and their partners will be able to try a variety of labor positions with birth balls and rebozos as well as practice breathing, relaxation, and visualization techniques.  Natural Childbirth Class (In-Person 5-Week Series, In-Person on Saturdays or Virtual 5-Week Series ~ [place]) This class series is designed for expectant parents who want to learn and practice natural methods of coping with the process of labor and childbirth.  Cesarean Birth Self-Paced eClass (English and Spanish) This online course provides comprehensive information you can trust as you prepare for a possible cesarean birth. In this class, you'll learn how to make your birth and recovery comfortable and joyful through instructive video clips, animations, and activities.  Waterbirth ~ Airline pilot Interested in a waterbirth? In addition to a consultation with your credentialed waterbirth provider, this free, informational online class will help you discover whether waterbirth is the right fit for you. Not all obstetrical practices offer waterbirth, so check with your healthcare provider.  Tour Probation officer) - [facility] [employer] our 4 minute video tour of [facility] located in [place].   [place] Parenting Education Options:  Pregnancy 101 (Virtual) Congratulations on your pregnancy! This class is geared toward moms in their first trimester, but everyone is welcome. We are excited to guide you through all aspects of supporting a healthy pregnancy. You will learn what to expect at routine prenatal care appointments, common postpartum adjustments, basic infant safety, and  breastfeeding.  Successful Partnering & Parenting ~ In-Person Workshop [facility]) This workshop inspires and equips partners of all economic levels, ages, and cultures to confidently care for their infants, support the birthing persons, and navigate their own transformations into new partners and parents. Learning activities are geared towards supporting partner, but moms are welcome to attend.  'Baby & Me' Parenting Group (Virtual on Wednesdays at 11am) Enjoy this time discussing newborn & infant parenting topics and family adjustment issues with other new parents in a relaxed environment. Each week brings a new speaker or baby-centered activity. This group offers support and connection to parents as they journey through the adjustments and struggles of that sometimes overwhelming first year after the birth of a child.  Baby Safety, CPR, & Choking Class ~ Virtual This life-saving information is meant to encourage parents as they learn important safety and prevention tips as well as infant CPR and relief of choking.  Breastfeeding Class (In-Person in [place]  or Virtual) Families learn what to expect in the first days and weeks of breastfeeding your newborn.   Breastfeeding Self-Paced eClass (English & Spanish) Families learn what to expect in the first days and weeks of breastfeeding your newborn.  Caring for Baby ~ In-Person, Virtual or Self-Paced Class This in-person class is for both expectant and adoptive parents who want to learn and practice the most up-to-date newborn care for their babies. Focus is on birth through the first six weeks of life.  CPR & Choking Relief for Infants & Children ~ In-Person Class [facility]) This in-person course is designed for any parent, expectant parent, or adult who cares for infants or children. Participants learn and demonstrate cardiopulmonary resuscitation and choking relief procedures for both infants and children.  Grandparent Love ~ In-Person  Class Grandparents will learn the most updated infant care and safety recommendations. They will discover ways to support their own children during the transition into the parenting role and receive tips on communicating with the new parents.  [place] Parenting Support Group Options:  Bereavement Grief Support Group (Pregnancy/Infant Loss) - Virtual This is an ongoing experience that meets once a month and is designed to help you honor the past, assist you in discovering tools to strengthen you today, and aid you in developing hope for the future.  Breastfeeding & Pumping Support Group (In-Person on Thursdays at 12pm or Virtual on Tuesdays at 5pm) Join [place] in-person each Thursday starting June 1st, 2023 at 12pm! This support group is free for all families looking for breastfeeding and/or pumping support.   Community-Based Childbirth Education Options:  [facility] Department Classes:  Childbirth education classes can help you get ready for a positive parenting experience. You can also meet other expectant parents and get free stuff for your baby. Each class runs for five weeks on the same night and costs $45 for the mother-to-be and her support person. Medicaid covers the cost if you are eligible. Call [phone] to register.  [facility] [employer] [employer] offers a variety of programs for the [employer] and is another great way to get connected. Please go to [email or web address] for more information.  Childbirth With A Twist! Be informed of your options, get educated on birth, understand what your body is doing, learn how to cope, and have a lot of fun and laughs all while doing it either from the comfort of your couch OR in our cozy office and classroom space near the [place] airport. If you are taking a virtual class, then class is taught LIVE, so you can ask questions and receive answers in real-time from an experienced doula and childbirth educator.  This  virtual childbirth education class will meet for five instruction times online.  Although we are based in [place], [place], this virtual class is open to anyone in the world. Please visit: [email or web address] for more information.  Books We Love: The Doula Guide to Childbirth by Harland German and Otila Back The First-Time Parent's Childbirth Handbook by Dr. Amie Critchley, CNM The Birth Partner by Truddie Crumble

## 2022-11-23 LAB — CBC/D/PLT+RPR+RH+ABO+RUBIGG...
Antibody Screen: NEGATIVE
Basophils Absolute: 0 10*3/uL (ref 0.0–0.2)
Basos: 0 %
EOS (ABSOLUTE): 0.1 10*3/uL (ref 0.0–0.4)
Eos: 2 %
HCV Ab: NONREACTIVE
HIV Screen 4th Generation wRfx: NONREACTIVE
Hematocrit: 35.2 % (ref 34.0–46.6)
Hemoglobin: 11.7 g/dL (ref 11.1–15.9)
Hepatitis B Surface Ag: NEGATIVE
Immature Grans (Abs): 0 10*3/uL (ref 0.0–0.1)
Immature Granulocytes: 1 %
Lymphocytes Absolute: 0.9 10*3/uL (ref 0.7–3.1)
Lymphs: 23 %
MCH: 28.9 pg (ref 26.6–33.0)
MCHC: 33.2 g/dL (ref 31.5–35.7)
MCV: 87 fL (ref 79–97)
Monocytes Absolute: 0.3 10*3/uL (ref 0.1–0.9)
Monocytes: 8 %
Neutrophils Absolute: 2.6 10*3/uL (ref 1.4–7.0)
Neutrophils: 66 %
Platelets: 280 10*3/uL (ref 150–450)
RBC: 4.05 x10E6/uL (ref 3.77–5.28)
RDW: 12.3 % (ref 11.7–15.4)
RPR Ser Ql: NONREACTIVE
Rh Factor: POSITIVE
Rubella Antibodies, IGG: 1.16 index (ref >0.99)
WBC: 3.9 10*3/uL (ref 3.4–10.8)

## 2022-11-23 LAB — CERVICOVAGINAL ANCILLARY ONLY
Chlamydia: NEGATIVE
Comment: NEGATIVE
Comment: NEGATIVE
Comment: NORMAL
Neisseria Gonorrhea: NEGATIVE
Trichomonas: NEGATIVE

## 2022-11-23 LAB — HCV INTERPRETATION

## 2022-11-24 LAB — URINE CULTURE, OB REFLEX

## 2022-11-24 LAB — CULTURE, OB URINE

## 2022-11-30 LAB — PANORAMA PRENATAL TEST FULL PANEL:PANORAMA TEST PLUS 5 ADDITIONAL MICRODELETIONS: FETAL FRACTION: 9.4

## 2022-12-02 LAB — HORIZON CUSTOM: REPORT SUMMARY: POSITIVE — AB

## 2022-12-07 ENCOUNTER — Encounter: Payer: Self-pay | Admitting: Emergency Medicine

## 2022-12-07 ENCOUNTER — Telehealth: Payer: Self-pay | Admitting: Emergency Medicine

## 2022-12-07 NOTE — Telephone Encounter (Signed)
Pt reports cough with sore throat x1 week. Denies fever, CP, or SOB.  Pt advised to received testing and evaluation at urgent care as symptoms are persisting with no relief with medication. Precautions given to go to MAU. Pregnancy safe medication list sent Via Mychart.

## 2022-12-08 ENCOUNTER — Inpatient Hospital Stay (HOSPITAL_COMMUNITY)
Admission: AD | Admit: 2022-12-08 | Discharge: 2022-12-08 | Disposition: A | Discharge status: Home / Self Care | Payer: Medicaid Other | Attending: Obstetrics & Gynecology | Admitting: Obstetrics & Gynecology

## 2022-12-08 ENCOUNTER — Encounter (HOSPITAL_COMMUNITY): Payer: Self-pay | Admitting: Obstetrics & Gynecology

## 2022-12-08 DIAGNOSIS — R059 Cough, unspecified: Secondary | ICD-10-CM | POA: Diagnosis not present

## 2022-12-08 DIAGNOSIS — Z3480 Encounter for supervision of other normal pregnancy, unspecified trimester: Secondary | ICD-10-CM

## 2022-12-08 DIAGNOSIS — O99512 Diseases of the respiratory system complicating pregnancy, second trimester: Secondary | ICD-10-CM | POA: Diagnosis not present

## 2022-12-08 DIAGNOSIS — Z3A16 16 weeks gestation of pregnancy: Secondary | ICD-10-CM | POA: Insufficient documentation

## 2022-12-08 DIAGNOSIS — J101 Influenza due to other identified influenza virus with other respiratory manifestations: Secondary | ICD-10-CM | POA: Insufficient documentation

## 2022-12-08 DIAGNOSIS — O30042 Twin pregnancy, dichorionic/diamniotic, second trimester: Secondary | ICD-10-CM | POA: Diagnosis not present

## 2022-12-08 DIAGNOSIS — O26892 Other specified pregnancy related conditions, second trimester: Secondary | ICD-10-CM | POA: Insufficient documentation

## 2022-12-08 DIAGNOSIS — R051 Acute cough: Secondary | ICD-10-CM

## 2022-12-08 DIAGNOSIS — Z1152 Encounter for screening for COVID-19: Secondary | ICD-10-CM | POA: Diagnosis not present

## 2022-12-08 DIAGNOSIS — R079 Chest pain, unspecified: Secondary | ICD-10-CM

## 2022-12-08 LAB — RESP PANEL BY RT-PCR (RSV, FLU A&B, COVID)  RVPGX2
Influenza A by PCR: NEGATIVE
Influenza B by PCR: POSITIVE — AB
Resp Syncytial Virus by PCR: NEGATIVE
SARS Coronavirus 2 by RT PCR: NEGATIVE

## 2022-12-08 LAB — GROUP A STREP BY PCR: Group A Strep by PCR: NOT DETECTED

## 2022-12-08 MED ORDER — GUAIFENESIN-CODEINE 100-10 MG/5ML PO SOLN: 5.0000 mL | ORAL | 0 refills | Status: DC | PRN

## 2022-12-08 MED ORDER — OSELTAMIVIR PHOSPHATE 75 MG PO CAPS: 75.0000 mg | ORAL_CAPSULE | Freq: Two times a day (BID) | ORAL | 0 refills | Status: AC

## 2022-12-08 MED ORDER — OSELTAMIVIR PHOSPHATE 75 MG PO CAPS
75.0000 mg | ORAL_CAPSULE | Freq: Two times a day (BID) | ORAL | Status: DC
  Administered 2022-12-08: 75 mg via ORAL
  Filled 2022-12-08 (×2): qty 1

## 2022-12-08 MED ORDER — GUAIFENESIN-CODEINE 100-10 MG/5ML PO SOLN
5.0000 mL | Freq: Once | ORAL | Status: AC
  Administered 2022-12-08: 5 mL via ORAL
  Filled 2022-12-08: qty 5

## 2022-12-08 MED ORDER — GUAIFENESIN-CODEINE 100-10 MG/5ML PO SOLN: 5.0000 mL | Freq: Once | ORAL | Status: DC

## 2022-12-08 NOTE — MAU Provider Note (Signed)
MAU Provider Note  History  [id number]  Arrival date and time: 12/08/22 1517   Chief Complaint  Patient presents with   Cough   Sore Throat    HPI Sonya [name] is a 21 y.o. G1P0000 at 49w4dby LMP with PMHx notable for cholelithiasis, major depression, who presents for cough and chest pain for 7 days.  Her cough started as a mild dry cough and in the last 2 to 3 days it could progressed to a wet cough that caused her chest pain every time she coughed.  No production of mucus when she coughs.  She also notes a feeling of a "head cold."  Admits to sore throat.  Denies sick contacts, though she lives with roommates who are out for the week.  Denies fevers, chills, shortness of breath, nausea/vomiting.  She is wearing her scopolamine patch, which she notes is working. Pt has taken Robitussin DM and cough drops for her symptoms, which have not helped.  She called her OB who told her to present herself to the MAU for further workup.  Vaginal bleeding: No LOF: No Fetal Movement: Yes Contractions: No  O/Positive/-- (12/07 0936)  OB History     Gravida  1   Para  0   Term  0   Preterm  0   AB  0   Living  0      SAB  0   IAB  0   Ectopic  0   Multiple  0   Live Births  0           Past Medical History:  Diagnosis Date   ADHD (attention deficit hyperactivity disorder)    no meds currently   Anxiety    History of seasonal allergies     Past Surgical History:  Procedure Laterality Date   CHOLECYSTECTOMY N/A 01/29/2018   Procedure: LAPAROSCOPIC CHOLECYSTECTOMY WITH INTRAOPERATIVE CHOLANGIOGRAM;  Surgeon: AStanford Scotland MD;  [place] [facility]  Service: Pediatrics;  Laterality: N/A;    Family History  Problem Relation Age of Onset   Epilepsy Mother    Schizophrenia Father    Epilepsy Brother     Social History   Socioeconomic History   Marital status: Single    Spouse name: Not on file   Number of children: Not on file   Years of education: Not on  file   Highest education level: Not on file  Occupational History   Not on file  Tobacco Use   Smoking status: Never   Smokeless tobacco: Never  Vaping Use   Vaping Use: Never used  Substance and Sexual Activity   Alcohol use: Not Currently   Drug use: Not Currently    Types: Marijuana    Comment: last smoked 2 weeks ago (early October 2023)   Sexual activity: Yes    Birth control/protection: Condom, None  Other Topics Concern   Not on file  Social History Narrative   Patient lives at home with mother, 141yobrother, 123yo 931yoand 733yobrothers. No smokers in the home. No pets in the home.   Social Determinants of Health   Financial Resource Strain: Low Risk  (01/03/2021)   Overall Financial Resource Strain (CARDIA)    Difficulty of Paying Living Expenses: Not hard at all  Food Insecurity: No Food Insecurity (01/03/2021)   Hunger Vital Sign    Worried About Running Out of Food in the Last Year: Never true    Ran Out of Food in the Last  Year: Never true  Transportation Needs: No Transportation Needs (01/03/2021)   PRAPARE - Hydrologist (Medical): No    Lack of Transportation (Non-Medical): No  Physical Activity: Insufficiently Active (01/03/2021)   Exercise Vital Sign    Days of Exercise per Week: 2 days    Minutes of Exercise per Session: 60 min  Stress: No Stress Concern Present (01/03/2021)   [place]    Feeling of Stress : Only a little  Social Connections: Socially Isolated (01/03/2021)   Social Connection and Isolation Panel [NHANES]    Frequency of Communication with Friends and Family: More than three times a week    Frequency of Social Gatherings with Friends and Family: Three times a week    Attends Religious Services: Never    Active Member of Clubs or Organizations: No    Attends Archivist Meetings: Never    Marital Status: Never married  Intimate Partner  Violence: Not At Risk (01/03/2021)   Humiliation, Afraid, Rape, and Kick questionnaire    Fear of Current or Ex-Partner: No    Emotionally Abused: No    Physically Abused: No    Sexually Abused: No    No Known Allergies  No current facility-administered medications on file prior to encounter.   Current Outpatient Medications on File Prior to Encounter  Medication Sig Dispense Refill   aspirin EC 81 MG tablet Take 1 tablet (81 mg total) by mouth daily. 60 tablet 2   Blood Pressure Monitoring (BLOOD PRESSURE KIT) DEVI 1 Device by Does not apply route once a week. 1 each 0   docusate sodium (COLACE) 250 MG capsule Take 1 capsule (250 mg total) by mouth daily. 10 capsule 0   glycopyrrolate (ROBINUL) 2 MG tablet Take 1 tablet (2 mg total) by mouth 3 (three) times daily as needed. 30 tablet 3   pantoprazole (PROTONIX) 20 MG tablet Take 1 tablet (20 mg total) by mouth daily. 30 tablet 6   polyethylene glycol powder (GLYCOLAX/MIRALAX) 17 GM/SCOOP powder Take 17 g by mouth 2 (two) times daily as needed for moderate constipation. 500 g 2   Prenatal 28-0.8 MG TABS Take 1 tablet by mouth daily. 30 tablet 12   promethazine (PHENERGAN) 12.5 MG tablet Take 2 tablets (25 mg total) by mouth every 6 (six) hours as needed for nausea or vomiting. 30 tablet 3   scopolamine (TRANSDERM-SCOP) 1 MG/3DAYS Place 1 patch (1.5 mg total) onto the skin every 3 (three) days. 10 patch 1     Review of Systems  Constitutional:  Negative for appetite change, chills and fever.  HENT:  Negative for congestion, facial swelling and postnasal drip.   Eyes:  Negative for photophobia.  Respiratory:  Positive for cough. Negative for shortness of breath.   Cardiovascular:  Positive for chest pain.  Gastrointestinal:  Negative for abdominal pain, nausea and vomiting.  Endocrine: Negative for polyuria.  Genitourinary:  Negative for flank pain and pelvic pain.  Musculoskeletal:  Negative for arthralgias.  Skin:  Negative for  rash.  Neurological:  Negative for weakness and light-headedness.  Psychiatric/Behavioral:  Negative for confusion.     Pertinent positives and negative per HPI, all others reviewed and negative  Physical Exam   BP (!) 105/54   Pulse (!) 124   Temp 98.4 F (36.9 C) (Oral)   Resp 15   Ht _0  (1.651 m)   Wt 60.8 kg   LMP 08/14/2022  SpO2 100%   BMI 22.30 kg/m   Patient Vitals for the past 24 hrs:  BP Temp Temp src Pulse Resp SpO2 Height Weight  12/08/22 1026 (!) 105/54 -- -- (!) 124 -- -- -- --  12/08/22 1011 103/62 98.4 F (36.9 C) Oral (!) 113 15 100 % _0  (1.651 m) 60.8 kg    Physical Exam Vitals reviewed.  Constitutional:      Appearance: Normal appearance.  HENT:     Head: Normocephalic and atraumatic.     Right Ear: Tympanic membrane, ear canal and external ear normal.     Left Ear: Tympanic membrane, ear canal and external ear normal.     Nose: Congestion present.     Mouth/Throat:     Pharynx: Posterior oropharyngeal erythema present.  Cardiovascular:     Rate and Rhythm: Normal rate and regular rhythm.  Pulmonary:     Effort: Pulmonary effort is normal.     Breath sounds: Normal breath sounds.  Abdominal:     Palpations: Abdomen is soft.  Skin:    General: Skin is warm.     Capillary Refill: Capillary refill takes less than 2 seconds.  Psychiatric:        Mood and Affect: Mood normal.     Bedside Ultrasound Pt informed that the ultrasound is considered a limited OB ultrasound and is not intended to be a complete ultrasound exam.  Patient also informed that the ultrasound is not being completed with the intent of assessing for fetal or placental anomalies or any pelvic abnormalities.  Explained that the purpose of today's ultrasound is to assess for  viability.  Patient acknowledges the purpose of the exam and the limitations of the study.    Findings: Di/Di IUP noted Cardiac activity: visualized x2  A: Baseline 148 on [place]  B: Baseline 154 on  [place]   Labs Results for orders placed or performed during the hospital encounter of 12/08/22 (from the past 24 hour(s))  Group A Strep by PCR     Status: None   Collection Time: 12/08/22 10:18 AM   Specimen: Throat; Sterile Swab  Result Value Ref Range   Group A Strep by PCR NOT DETECTED NOT DETECTED  Resp panel by RT-PCR (RSV, Flu A&B, Covid) Throat     Status: Abnormal   Collection Time: 12/08/22 10:18 AM   Specimen: Throat; Nasal Swab  Result Value Ref Range   SARS Coronavirus 2 by RT PCR NEGATIVE NEGATIVE   Influenza A by PCR NEGATIVE NEGATIVE   Influenza B by PCR POSITIVE (A) NEGATIVE   Resp Syncytial Virus by PCR NEGATIVE NEGATIVE    Imaging No results found.  MAU Course  Procedures Lab Orders         Group A Strep by PCR         Resp panel by RT-PCR (RSV, Flu A&B, Covid) Anterior Nasal Swab     Meds ordered this encounter  Medications   DISCONTD: guaiFENesin-codeine 100-10 MG/5ML solution 5 mL   guaiFENesin-codeine 100-10 MG/5ML solution 5 mL   oseltamivir (TAMIFLU) capsule 75 mg   oseltamivir (TAMIFLU) 75 MG capsule    Sig: Take 1 capsule (75 mg total) by mouth 2 (two) times daily for 9 doses.    Dispense:  9 capsule    Refill:  0   guaiFENesin-codeine 100-10 MG/5ML syrup    Sig: Take 5 mLs by mouth every 4 (four) hours as needed for cough.    Dispense:  120 mL  Refill:  0   Imaging Orders  No imaging studies ordered today    MDM moderate  Assessment and Plan  Chest pain Cough [redacted] weeks pregnant with Kathi Der twins Fetal Well being Ultrasound fetal heart tones as above Discussed patient with RN. NST reviewed.   Patient presents with cough and chest pain.  On exam patient has wet cough with no production, and no tenderness to palpation of her chest, no abdominal tenderness.  Bedside ultrasound unremarkable.  COVID/flu/strep swab collected.  Guaifenesin with codeine administered for patient's cough.  Positive influenza B test.  Started on oseltamivir 75 mg  twice daily for 5 days.  Gave return and preterm labor precautions.  Follow-up with primary OB.  Dispo: discharged to home in stable condition.    Allergies as of 12/08/2022   No Known Allergies      Medication List     TAKE these medications    aspirin EC 81 MG tablet Take 1 tablet (81 mg total) by mouth daily.   Blood Pressure Kit Devi 1 Device by Does not apply route once a week.   docusate sodium 250 MG capsule Commonly known as: COLACE Take 1 capsule (250 mg total) by mouth daily.   glycopyrrolate 2 MG tablet Commonly known as: ROBINUL Take 1 tablet (2 mg total) by mouth 3 (three) times daily as needed.   guaiFENesin-codeine 100-10 MG/5ML syrup Take 5 mLs by mouth every 4 (four) hours as needed for cough.   oseltamivir 75 MG capsule Commonly known as: TAMIFLU Take 1 capsule (75 mg total) by mouth 2 (two) times daily for 9 doses.   pantoprazole 20 MG tablet Commonly known as: Protonix Take 1 tablet (20 mg total) by mouth daily.   polyethylene glycol powder 17 GM/SCOOP powder Commonly known as: GLYCOLAX/MIRALAX Take 17 g by mouth 2 (two) times daily as needed for moderate constipation.   Prenatal 28-0.8 MG Tabs Take 1 tablet by mouth daily.   promethazine 12.5 MG tablet Commonly known as: PHENERGAN Take 2 tablets (25 mg total) by mouth every 6 (six) hours as needed for nausea or vomiting.   scopolamine 1 MG/3DAYS Commonly known as: TRANSDERM-SCOP Place 1 patch (1.5 mg total) onto the skin every 3 (three) days.        Shelda Pal, [place] Fellow, Faculty practice [place] for [facility] 12/08/22  12:34 PM

## 2022-12-08 NOTE — Discharge Instructions (Signed)

## 2022-12-08 NOTE — MAU Note (Signed)
...  Sonya [name] is a 21 y.o. at [redacted]w[redacted]d here in MAU reporting: Cough x1 week accompanied by pain in her chest and throat when she coughs. She reports yesterday she began experiencing a sore throat as well. She states she has also been experiencing pressure in her head and eyes, especially with positional changes. She reports she does not have her chest pain at rest when she is coughing. Denies SOB. Denies VB or LOF.   Last PO Meds: Robitussin DM last night prior to bed Cough drops - these do not work for her.  Onset of complaint: x1 week Pain score:  8/10 chest and throat 8/10 head pressure  Vitals:   12/08/22 1011  BP: 103/62  Pulse: (!) 113  Resp: 15  Temp: 98.4 F (36.9 C)  SpO2: 100%     FHT: twin gestation, unable to doppler FHT. Lab orders placed from triage: Resp Panel PCR, and Group A Strep per MD

## 2022-12-09 ENCOUNTER — Encounter (HOSPITAL_COMMUNITY): Payer: Self-pay | Admitting: Obstetrics and Gynecology

## 2022-12-09 ENCOUNTER — Inpatient Hospital Stay (HOSPITAL_COMMUNITY)
Admission: AD | Admit: 2022-12-09 | Discharge: 2022-12-09 | Disposition: A | Discharge status: Home / Self Care | Payer: Medicaid Other | Attending: Obstetrics and Gynecology | Admitting: Obstetrics and Gynecology

## 2022-12-09 ENCOUNTER — Other Ambulatory Visit: Payer: Self-pay

## 2022-12-09 DIAGNOSIS — O219 Vomiting of pregnancy, unspecified: Secondary | ICD-10-CM | POA: Diagnosis not present

## 2022-12-09 DIAGNOSIS — Z3A16 16 weeks gestation of pregnancy: Secondary | ICD-10-CM | POA: Insufficient documentation

## 2022-12-09 DIAGNOSIS — J101 Influenza due to other identified influenza virus with other respiratory manifestations: Secondary | ICD-10-CM | POA: Diagnosis not present

## 2022-12-09 DIAGNOSIS — O99512 Diseases of the respiratory system complicating pregnancy, second trimester: Secondary | ICD-10-CM | POA: Insufficient documentation

## 2022-12-09 DIAGNOSIS — O30042 Twin pregnancy, dichorionic/diamniotic, second trimester: Secondary | ICD-10-CM | POA: Insufficient documentation

## 2022-12-09 MED ORDER — OSELTAMIVIR PHOSPHATE 75 MG PO CAPS
75.0000 mg | ORAL_CAPSULE | Freq: Once | ORAL | Status: DC
  Filled 2022-12-09: qty 1

## 2022-12-09 MED ORDER — BENZONATATE 100 MG PO CAPS
200.0000 mg | ORAL_CAPSULE | Freq: Once | ORAL | Status: AC
  Administered 2022-12-09: 200 mg via ORAL
  Filled 2022-12-09: qty 2

## 2022-12-09 MED ORDER — LACTATED RINGERS IV BOLUS
500.0000 mL | Freq: Once | INTRAVENOUS | Status: AC
  Administered 2022-12-09: 500 mL via INTRAVENOUS

## 2022-12-09 MED ORDER — KETOROLAC TROMETHAMINE 30 MG/ML IJ SOLN
30.0000 mg | Freq: Once | INTRAMUSCULAR | Status: AC
  Administered 2022-12-09: 30 mg via INTRAVENOUS
  Filled 2022-12-09: qty 1

## 2022-12-09 MED ORDER — PANTOPRAZOLE SODIUM 40 MG IV SOLR: 20.0000 mg | Freq: Once | INTRAVENOUS | Status: DC

## 2022-12-09 MED ORDER — PROCHLORPERAZINE EDISYLATE 10 MG/2ML IJ SOLN
10.0000 mg | Freq: Once | INTRAMUSCULAR | Status: AC
  Administered 2022-12-09: 10 mg via INTRAVENOUS
  Filled 2022-12-09: qty 2

## 2022-12-09 MED ORDER — METOCLOPRAMIDE HCL 5 MG/ML IJ SOLN
5.0000 mg | Freq: Once | INTRAMUSCULAR | Status: DC
  Filled 2022-12-09: qty 2

## 2022-12-09 MED ORDER — BENZONATATE 150 MG PO CAPS: 200.0000 mg | ORAL_CAPSULE | Freq: Three times a day (TID) | ORAL | 0 refills | Status: DC | PRN

## 2022-12-09 MED ORDER — LACTATED RINGERS IV BOLUS
1000.0000 mL | Freq: Once | INTRAVENOUS | Status: AC
  Administered 2022-12-09: 1000 mL via INTRAVENOUS

## 2022-12-09 NOTE — MAU Note (Signed)
Pt reports to mau with c/o worsening flu symptoms after being dc from mau yesterday.  Pt states she vomited after taking first 2 doses of tamiflu yesterday, but states she kept her dose down this morning.  Pt reports generalized body aches and chest pain.  Pt unable to describe if chest pain is related to coughing or how it feels. Reports she last took her cough medicine around 0900. Denies vag bleeding or LOF

## 2022-12-09 NOTE — MAU Provider Note (Signed)
History     [id number]  Arrival date and time: 12/09/22 1131    Chief Complaint  Patient presents with   Influenza   Generalized Body Aches    HPI Sonya [name] is a 21 y.o. at 18w5dwith di/di twin gestation by LMP with, who presents for generalized bodyaches, weakness, nausea and vomiting.  She was seen here in MAU yesterday, was diagnosed with influenza B and discharged on Tamiflu.  She states that since taking the Tamiflu, she has had lots of nausea and vomiting.  She is unable to keep any food or fluids down at this time.  She had been using scopolamine patch for her nausea, however her current patch is worn out and she has not picked up the new prescription from pharmacy. She is also coughing a lot.  She is about [redacted] weeks gestation, with dichorionic/diamniotic twins.  Pregnancy has been otherwise unremarkable.  She has no abdominal pain or contractions, no vaginal bleeding, leakage of fluid.  Fetal Movement: yet to have quickening Contractions: No  O/Positive/-- (12/07 0936)  OB History     Gravida  1   Para  0   Term  0   Preterm  0   AB  0   Living  0      SAB  0   IAB  0   Ectopic  0   Multiple  0   Live Births  0           Past Medical History:  Diagnosis Date   ADHD (attention deficit hyperactivity disorder)    no meds currently   Anxiety    History of seasonal allergies     Past Surgical History:  Procedure Laterality Date   CHOLECYSTECTOMY N/A 01/29/2018   Procedure: LAPAROSCOPIC CHOLECYSTECTOMY WITH INTRAOPERATIVE CHOLANGIOGRAM;  Surgeon: AStanford Scotland MD;  [place] [facility]  Service: Pediatrics;  Laterality: N/A;    Family History  Problem Relation Age of Onset   Epilepsy Mother    Schizophrenia Father    Epilepsy Brother     Social History   Socioeconomic History   Marital status: Single    Spouse name: Not on file   Number of children: Not on file   Years of education: Not on file   Highest education level: Not  on file  Occupational History   Not on file  Tobacco Use   Smoking status: Never   Smokeless tobacco: Never  Vaping Use   Vaping Use: Never used  Substance and Sexual Activity   Alcohol use: Not Currently   Drug use: Not Currently    Types: Marijuana    Comment: last smoked 2 weeks ago (early October 2023)   Sexual activity: Yes    Birth control/protection: Condom, None  Other Topics Concern   Not on file  Social History Narrative   Patient lives at home with mother, 188yobrother, 160yo 927yoand 728yobrothers. No smokers in the home. No pets in the home.   Social Determinants of Health   Financial Resource Strain: Low Risk  (01/03/2021)   Overall Financial Resource Strain (CARDIA)    Difficulty of Paying Living Expenses: Not hard at all  Food Insecurity: No Food Insecurity (01/03/2021)   Hunger Vital Sign    Worried About Running Out of Food in the Last Year: Never true    Ran Out of Food in the Last Year: Never true  Transportation Needs: No Transportation Needs (01/03/2021)   PRAPARE - Transportation  Lack of Transportation (Medical): No    Lack of Transportation (Non-Medical): No  Physical Activity: Insufficiently Active (01/03/2021)   Exercise Vital Sign    Days of Exercise per Week: 2 days    Minutes of Exercise per Session: 60 min  Stress: No Stress Concern Present (01/03/2021)   [place]    Feeling of Stress : Only a little  Social Connections: Socially Isolated (01/03/2021)   Social Connection and Isolation Panel [NHANES]    Frequency of Communication with Friends and Family: More than three times a week    Frequency of Social Gatherings with Friends and Family: Three times a week    Attends Religious Services: Never    Active Member of Clubs or Organizations: No    Attends Archivist Meetings: Never    Marital Status: Never married  Intimate Partner Violence: Not At Risk (01/03/2021)    Humiliation, Afraid, Rape, and Kick questionnaire    Fear of Current or Ex-Partner: No    Emotionally Abused: No    Physically Abused: No    Sexually Abused: No    No Known Allergies  No current facility-administered medications on file prior to encounter.   Current Outpatient Medications on File Prior to Encounter  Medication Sig Dispense Refill   guaiFENesin-codeine 100-10 MG/5ML syrup Take 5 mLs by mouth every 4 (four) hours as needed for cough. 120 mL 0   oseltamivir (TAMIFLU) 75 MG capsule Take 1 capsule (75 mg total) by mouth 2 (two) times daily for 9 doses. 9 capsule 0   aspirin EC 81 MG tablet Take 1 tablet (81 mg total) by mouth daily. 60 tablet 2   Blood Pressure Monitoring (BLOOD PRESSURE KIT) DEVI 1 Device by Does not apply route once a week. 1 each 0   docusate sodium (COLACE) 250 MG capsule Take 1 capsule (250 mg total) by mouth daily. 10 capsule 0   glycopyrrolate (ROBINUL) 2 MG tablet Take 1 tablet (2 mg total) by mouth 3 (three) times daily as needed. 30 tablet 3   pantoprazole (PROTONIX) 20 MG tablet Take 1 tablet (20 mg total) by mouth daily. 30 tablet 6   polyethylene glycol powder (GLYCOLAX/MIRALAX) 17 GM/SCOOP powder Take 17 g by mouth 2 (two) times daily as needed for moderate constipation. 500 g 2   Prenatal 28-0.8 MG TABS Take 1 tablet by mouth daily. 30 tablet 12   promethazine (PHENERGAN) 12.5 MG tablet Take 2 tablets (25 mg total) by mouth every 6 (six) hours as needed for nausea or vomiting. 30 tablet 3   scopolamine (TRANSDERM-SCOP) 1 MG/3DAYS Place 1 patch (1.5 mg total) onto the skin every 3 (three) days. 10 patch 1    Review of Systems  Constitutional:  Negative for chills and fever.  Eyes:  Negative for blurred vision.  Cardiovascular:  Negative for leg swelling.  Gastrointestinal:  Negative for abdominal pain and vomiting.  Genitourinary:  Negative for dysuria, frequency and urgency.  Musculoskeletal:  Negative for myalgias.  Skin:  Negative for  itching.  Neurological:  Negative for dizziness.   Pertinent positives and negative per HPI, all others reviewed and negative  Physical Exam   BP 108/61   Pulse (!) 106   Temp 98.1 F (36.7 C) (Oral)   Resp 16   LMP 08/14/2022   Patient Vitals for the past 24 hrs:  BP Temp Temp src Pulse Resp  12/09/22 1316 108/61 -- -- (!) 106 --  12/09/22  1145 128/68 98.1 F (36.7 C) Oral (!) 110 16   Physical Exam Vitals reviewed.  Constitutional:      General: She is not in acute distress.    Appearance: She is well-developed. She is ill-appearing. She is not toxic-appearing.  HENT:     Head: Normocephalic and atraumatic.     Mouth/Throat:     Mouth: Mucous membranes are moist.  Eyes:     Extraocular Movements: Extraocular movements intact.  Cardiovascular:     Rate and Rhythm: Regular rhythm. Tachycardia present.  Pulmonary:     Effort: Pulmonary effort is normal. No respiratory distress.     Breath sounds: Normal breath sounds. No stridor. No wheezing or rhonchi.  Abdominal:     Palpations: Abdomen is soft.  Skin:    General: Skin is warm and dry.  Neurological:     Mental Status: She is alert and oriented to person, place, and time.  Psychiatric:        Mood and Affect: Mood normal.        Behavior: Behavior normal.     Bedside Ultrasound Pt informed that the ultrasound is considered a limited OB ultrasound and is not intended to be a complete ultrasound exam.  Patient also informed that the ultrasound is not being completed with the intent of assessing for fetal or placental anomalies or any pelvic abnormalities.  Explained that the purpose of today's ultrasound is to assess for   FHR  and viability.  Patient acknowledges the purpose of the exam and the limitations of the study.     My interpretation:  Baby A - FHR - 146bpm  Baby B - FHR - 148bpm   Labs No results found for this or any previous visit (from the past 24 hour(s)).  Imaging No results found.  MAU  Course  Procedures Lab Orders  No laboratory test(s) ordered today   Meds ordered this encounter  Medications   lactated ringers bolus 1,000 mL   DISCONTD: metoCLOPramide (REGLAN) injection 5 mg   ketorolac (TORADOL) 30 MG/ML injection 30 mg   prochlorperazine (COMPAZINE) injection 10 mg   benzonatate (TESSALON) capsule 200 mg   pantoprazole (PROTONIX) injection 20 mg   lactated ringers bolus 500 mL   oseltamivir (TAMIFLU) capsule 75 mg   benzonatate 150 MG CAPS    Sig: Take 1.3333 capsules (200 mg total) by mouth 3 (three) times daily as needed for cough.    Dispense:  30 capsule    Refill:  0   Imaging Orders  No imaging studies ordered today   MDM moderate  Assessment and Plan  21 year old G1, P0 at 16 weeks and 5 days, UA with worsening symptoms following a diagnosis of influenza B yesterday.  Her most prominent symptoms on arrival include persistent cough, nausea and vomiting, body aches and generalized weakness.  She received a dose of 1.5 L LR bolus, IV Toradol, Compazine, benzonatate.  Did have good improvement in her symptoms. She has a prescription of scopolamine patch at the pharmacy which she plans to pick up and feels it would be enough to help her nausea. I have sent a prescription of benzonatate for her which she will continue at home. She will continue with her Tamiflu, get lots of rest and try to keep hydrated.  #FWB Both babies HR noted to be normal   Dispo: discharged to home in stable condition.  Discharge Instructions     Discharge patient   Complete by: As directed  Discharge disposition: 01-Home or Self Care   Discharge patient date: 12/09/2022      Allergies as of 12/09/2022   No Known Allergies      Medication List     STOP taking these medications    guaiFENesin-codeine 100-10 MG/5ML syrup       TAKE these medications    aspirin EC 81 MG tablet Take 1 tablet (81 mg total) by mouth daily.   Benzonatate 150 MG Caps Take  1.3333 capsules (200 mg total) by mouth 3 (three) times daily as needed for cough.   Blood Pressure Kit Devi 1 Device by Does not apply route once a week.   docusate sodium 250 MG capsule Commonly known as: COLACE Take 1 capsule (250 mg total) by mouth daily.   glycopyrrolate 2 MG tablet Commonly known as: ROBINUL Take 1 tablet (2 mg total) by mouth 3 (three) times daily as needed.   oseltamivir 75 MG capsule Commonly known as: TAMIFLU Take 1 capsule (75 mg total) by mouth 2 (two) times daily for 9 doses.   pantoprazole 20 MG tablet Commonly known as: Protonix Take 1 tablet (20 mg total) by mouth daily.   polyethylene glycol powder 17 GM/SCOOP powder Commonly known as: GLYCOLAX/MIRALAX Take 17 g by mouth 2 (two) times daily as needed for moderate constipation.   Prenatal 28-0.8 MG Tabs Take 1 tablet by mouth daily.   promethazine 12.5 MG tablet Commonly known as: PHENERGAN Take 2 tablets (25 mg total) by mouth every 6 (six) hours as needed for nausea or vomiting.   scopolamine 1 MG/3DAYS Commonly known as: TRANSDERM-SCOP Place 1 patch (1.5 mg total) onto the skin every 3 (three) days.       Liliane Channel MD MPH OB Fellow, [place] for [place] 12/09/2022

## 2022-12-13 ENCOUNTER — Other Ambulatory Visit: Payer: Self-pay

## 2022-12-13 ENCOUNTER — Inpatient Hospital Stay (HOSPITAL_COMMUNITY)
Admission: AD | Admit: 2022-12-13 | Discharge: 2022-12-13 | Disposition: A | Discharge status: Home / Self Care | Payer: Medicaid Other | Attending: Obstetrics and Gynecology | Admitting: Obstetrics and Gynecology

## 2022-12-13 ENCOUNTER — Encounter (HOSPITAL_COMMUNITY): Payer: Self-pay | Admitting: Obstetrics and Gynecology

## 2022-12-13 DIAGNOSIS — Z3A17 17 weeks gestation of pregnancy: Secondary | ICD-10-CM

## 2022-12-13 DIAGNOSIS — O30042 Twin pregnancy, dichorionic/diamniotic, second trimester: Secondary | ICD-10-CM | POA: Insufficient documentation

## 2022-12-13 DIAGNOSIS — O26892 Other specified pregnancy related conditions, second trimester: Secondary | ICD-10-CM | POA: Insufficient documentation

## 2022-12-13 DIAGNOSIS — O219 Vomiting of pregnancy, unspecified: Secondary | ICD-10-CM | POA: Diagnosis not present

## 2022-12-13 LAB — URINALYSIS, ROUTINE W REFLEX MICROSCOPIC
Glucose, UA: NEGATIVE mg/dL
Hgb urine dipstick: NEGATIVE
Ketones, ur: 80 mg/dL — AB
Leukocytes,Ua: NEGATIVE
Nitrite: NEGATIVE
Protein, ur: 100 mg/dL — AB
Specific Gravity, Urine: 1.028 (ref 1.005–1.030)
pH: 6 (ref 5.0–8.0)

## 2022-12-13 MED ORDER — SCOPOLAMINE 1 MG/3DAYS TD PT72
1.0000 | MEDICATED_PATCH | Freq: Once | TRANSDERMAL | Status: DC
  Administered 2022-12-13: 1.5 mg via TRANSDERMAL
  Filled 2022-12-13: qty 1

## 2022-12-13 MED ORDER — LACTATED RINGERS IV BOLUS
1000.0000 mL | Freq: Once | INTRAVENOUS | Status: AC
  Administered 2022-12-13: 1000 mL via INTRAVENOUS

## 2022-12-13 MED ORDER — SCOPOLAMINE 1 MG/3DAYS TD PT72: 1.0000 | MEDICATED_PATCH | TRANSDERMAL | 1 refills | Status: DC

## 2022-12-13 MED ORDER — DIPHENHYDRAMINE HCL 50 MG/ML IJ SOLN
25.0000 mg | Freq: Once | INTRAMUSCULAR | Status: AC
  Administered 2022-12-13: 25 mg via INTRAVENOUS
  Filled 2022-12-13: qty 1

## 2022-12-13 MED ORDER — FAMOTIDINE IN NACL 20-0.9 MG/50ML-% IV SOLN
20.0000 mg | Freq: Once | INTRAVENOUS | Status: AC
  Administered 2022-12-13: 20 mg via INTRAVENOUS
  Filled 2022-12-13: qty 50

## 2022-12-13 NOTE — MAU Provider Note (Signed)
History     [id number]  Arrival date and time: 12/13/22 1001    Chief Complaint  Patient presents with   Abdominal Pain   Nausea     HPI Sonya [name] is a 21 y.o. at 98w2dwith di/di twins who presents for nausea & vomiting. Symptoms have worsened since being diagnosed with the flu. Has just finished tamiflu dosing. Has run out of scopolamine patches. Has not taken other antiemetic previously prescribed due to fear of side effects, specifically constipation & anxiety. States she hasn't been able to keep anything down all week. Also continues to have body aches & abdominal pain.   OB History     Gravida  1   Para  0   Term  0   Preterm  0   AB  0   Living  0      SAB  0   IAB  0   Ectopic  0   Multiple  0   Live Births  0           Past Medical History:  Diagnosis Date   ADHD (attention deficit hyperactivity disorder)    no meds currently   Anxiety    History of seasonal allergies     Past Surgical History:  Procedure Laterality Date   CHOLECYSTECTOMY N/A 01/29/2018   Procedure: LAPAROSCOPIC CHOLECYSTECTOMY WITH INTRAOPERATIVE CHOLANGIOGRAM;  Surgeon: AStanford Scotland MD;  Location: [facility]  Service: Pediatrics;  Laterality: N/A;    Family History  Problem Relation Age of Onset   Epilepsy Mother    Schizophrenia Father    Epilepsy Brother     No Known Allergies  No current facility-administered medications on file prior to encounter.   Current Outpatient Medications on File Prior to Encounter  Medication Sig Dispense Refill   aspirin EC 81 MG tablet Take 1 tablet (81 mg total) by mouth daily. 60 tablet 2   benzonatate 150 MG CAPS Take 1.3333 capsules (200 mg total) by mouth 3 (three) times daily as needed for cough. 30 capsule 0   Blood Pressure Monitoring (BLOOD PRESSURE KIT) DEVI 1 Device by Does not apply route once a week. 1 each 0   docusate sodium (COLACE) 250 MG capsule Take 1 capsule (250 mg total) by mouth daily. 10 capsule 0    glycopyrrolate (ROBINUL) 2 MG tablet Take 1 tablet (2 mg total) by mouth 3 (three) times daily as needed. 30 tablet 3   oseltamivir (TAMIFLU) 75 MG capsule Take 1 capsule (75 mg total) by mouth 2 (two) times daily for 9 doses. 9 capsule 0   pantoprazole (PROTONIX) 20 MG tablet Take 1 tablet (20 mg total) by mouth daily. 30 tablet 6   polyethylene glycol powder (GLYCOLAX/MIRALAX) 17 GM/SCOOP powder Take 17 g by mouth 2 (two) times daily as needed for moderate constipation. 500 g 2   Prenatal 28-0.8 MG TABS Take 1 tablet by mouth daily. 30 tablet 12   promethazine (PHENERGAN) 12.5 MG tablet Take 2 tablets (25 mg total) by mouth every 6 (six) hours as needed for nausea or vomiting. 30 tablet 3     ROS Pertinent positives and negative per HPI, all others reviewed and negative  Physical Exam   BP 106/68 (BP Location: Right Arm)   Pulse 68   Temp 98.1 F (36.7 C) (Oral)   Resp 16   Ht _0  (1.651 m)   Wt 57.7 kg   LMP 08/14/2022   SpO2 95% Comment: room air  BMI 21.17 kg/m   Patient Vitals for the past 24 hrs:  BP Temp Temp src Pulse Resp SpO2 Height Weight  12/13/22 1259 106/68 98.1 F (36.7 C) Oral 68 16 -- -- --  12/13/22 1023 118/69 98.2 F (36.8 C) Oral (!) 115 16 95 % -- --  12/13/22 1018 -- -- -- -- -- -- _0  (1.651 m) 57.7 kg    Physical Exam Vitals and nursing note reviewed.  Constitutional:      General: She is not in acute distress.    Appearance: She is well-developed. She is not ill-appearing.  HENT:     Head: Normocephalic and atraumatic.  Pulmonary:     Effort: Pulmonary effort is normal. No respiratory distress.  Neurological:     Mental Status: She is alert.  Psychiatric:        Mood and Affect: Mood normal.        Behavior: Behavior normal.       Labs Results for orders placed or performed during the hospital encounter of 12/13/22 (from the past 24 hour(s))  Urinalysis, Routine w reflex microscopic Urine, Clean Catch     Status: Abnormal    Collection Time: 12/13/22 10:53 AM  Result Value Ref Range   Color, Urine AMBER (A) YELLOW   APPearance HAZY (A) CLEAR   Specific Gravity, Urine 1.028 1.005 - 1.030   pH 6.0 5.0 - 8.0   Glucose, UA NEGATIVE NEGATIVE mg/dL   Hgb urine dipstick NEGATIVE NEGATIVE   Bilirubin Urine SMALL (A) NEGATIVE   Ketones, ur 80 (A) NEGATIVE mg/dL   Protein, ur 100 (A) NEGATIVE mg/dL   Nitrite NEGATIVE NEGATIVE   Leukocytes,Ua NEGATIVE NEGATIVE   RBC / HPF 0-5 0 - 5 RBC/hpf   WBC, UA 6-10 0 - 5 WBC/hpf   Bacteria, UA RARE (A) NONE SEEN   Squamous Epithelial / LPF 0-5 0 - 5 /HPF   Mucus PRESENT     Imaging No results found.  MAU Course  Procedures Lab Orders         Culture, OB Urine         Urinalysis, Routine w reflex microscopic Urine, Clean Catch    Meds ordered this encounter  Medications   lactated ringers bolus 1,000 mL   famotidine (PEPCID) IVPB 20 mg premix   diphenhydrAMINE (BENADRYL) injection 25 mg   scopolamine (TRANSDERM-SCOP) 1 MG/3DAYS 1.5 mg   scopolamine (TRANSDERM-SCOP) 1 MG/3DAYS    Sig: Place 1 patch (1.5 mg total) onto the skin every 3 (three) days.    Dispense:  10 patch    Refill:  1    Order Specific Question:   Supervising Provider    Answer:   CONSTANT, [place] [[id number]]   Imaging Orders  No imaging studies ordered today    MDM FHT x2 present via doppler  After discussion with patient -she is agreeable to IV fluids, pepcid, benadryl, & scop patch. Declines reglan, compazine, & phenergan due to potential extrapyramidal symptoms.   Patient appears much more comfortable & able to tolerate crackers after treatment.  Assessment and Plan   1. Nausea and vomiting during pregnancy prior to [redacted] weeks gestation   2. [redacted] weeks gestation of pregnancy    -Refilled scopolamine. Discussed use of OTC meds for n/v including meclizine or benadryl.     Jorje Guild, NP 12/13/22 1:28 PM

## 2022-12-13 NOTE — MAU Note (Signed)
Sonya [name] is a 21 y.o. at [redacted]w[redacted]d here in MAU reporting: since previous MAU visit has not been able to keep down food or liquid. States constant vomiting. Also having abdominal pain. Is not currently taking any antiemetics. No bleeding or discharge.   Onset of complaint: ongoing  Pain score: 8/10  Vitals:   12/13/22 1023  BP: 118/69  Pulse: (!) 115  Resp: 16  Temp: 98.2 F (36.8 C)  SpO2: 95%     QMG:NOIBBCWU  Lab orders placed from triage: UA

## 2022-12-13 NOTE — Progress Notes (Signed)
Patient requesting 2nd ginger ale. Given.

## 2022-12-13 NOTE — Progress Notes (Signed)
Patient mother at bedside requesting that we feed the patient as she hasn't eaten in a week. Mother also wanting to know plan of care. Discussed plan of care with mother and patient including medication options that patient has already refused and medications given. Mother request crackers at this time. Graham crackers and 2 packs of saltine crackers given to patient.

## 2022-12-13 NOTE — Progress Notes (Signed)
Patient requesting ice chips and ginger ale. Both given.

## 2022-12-15 LAB — CULTURE, OB URINE
Culture: NO GROWTH
Special Requests: NORMAL

## 2022-12-17 NOTE — L&D Delivery Note (Addendum)
OB/[facility]N Faculty Practice Delivery Note  Khyia Afifi is a 22 y.o. G1P0000 s/p SVD of Mercie Eon twins at [redacted]w[redacted]d. She was admitted for P PROM.   ROM: 22h 45m with clear fluid GBS Status: Previous negative, did receive penicillin throughout labor process due to initial GBS unknown status Maximum Maternal Temperature:  Temp (24hrs), Avg:98.4 F (36.9 C), Min:97.5 F (36.4 C), Max:99.3 F (37.4 C)    Labor Progress: Patient arrived at 3 cm dilation and was induced with Pitocin, AROM of forebag.   Twin A Delivery Date/Time: 04/22/2023 at 0506 Delivery: Called to room and patient was complete and pushing. Head delivered in direct OP position with brow presentation. No nuchal cord present. Shoulder and body delivered in usual fashion. Infant with spontaneous cry, placed on mother's abdomen, dried and stimulated. Cord clamped x 2 after 1-minute delay, and cut by provider. Cord blood drawn.  And awaited for delivery of second fetus.   Twin B Delivery Date/Time: 04/22/2023 at 0522 Delivery: After delivery of first fetus evaluated and felt hand presentation.  Hand was replaced and infant came down in direct OP.  Initially difficulty with obtaining FHT tracing, FSE placed.  FHT confirmed to be 90 bpm.  Recommendation to proceed with vacuum assisted delivery due to fetal concerns.  Head noted in direct OP presentation.  Kiwi applied, one pull, no pop offs.  Head delivered in direct OP presentation. No nuchal cord present. Shoulder and body delivered in usual fashion. Infant with spontaneous cry, placed on mother's abdomen, dried and stimulated. Cord clamped x 2 after 1-minute delay, and cut by FOB. Cord blood drawn. Placenta delivered spontaneously with gentle cord traction. Fundus firm with massage and Pitocin. Labia, perineum, vagina, and cervix inspected with first-degree perineal laceration repaired with 3-0 Vicryl suture.  TXA and Methergine given due to ongoing bleeding and uterine atony.  Placenta:   spontaneous, intact, 3 vessel cord  Complications: None Lacerations: First-degree perineal EBL: 518 mL Analgesia: Epidural in place  Infant: APGAR (1 MIN):    Myong, Ravan [[id number]]  [address] Rmoni [[id number]]  8    APGAR (5 MINS):    Makaylla, Santoro [[id number]]  [address] Sarajean [[id number]]  9    APGAR (10 MINS):    Caoilainn, Quickle [[id number]]     Ilany, Tardie [[id number]]      Weight: Pending  Derrel Nip, MD  OB Fellow  04/22/2023 10:56 PM

## 2022-12-20 ENCOUNTER — Encounter: Payer: Medicaid Other | Admitting: Obstetrics and Gynecology

## 2022-12-20 ENCOUNTER — Ambulatory Visit (INDEPENDENT_AMBULATORY_CARE_PROVIDER_SITE_OTHER): Payer: Medicaid Other | Admitting: Obstetrics and Gynecology

## 2022-12-20 VITALS — BP 114/75 | HR 93 | Wt 135.0 lb

## 2022-12-20 DIAGNOSIS — O30049 Twin pregnancy, dichorionic/diamniotic, unspecified trimester: Secondary | ICD-10-CM | POA: Insufficient documentation

## 2022-12-20 DIAGNOSIS — Z3482 Encounter for supervision of other normal pregnancy, second trimester: Secondary | ICD-10-CM

## 2022-12-20 DIAGNOSIS — Z3A18 18 weeks gestation of pregnancy: Secondary | ICD-10-CM

## 2022-12-20 DIAGNOSIS — Z3480 Encounter for supervision of other normal pregnancy, unspecified trimester: Secondary | ICD-10-CM

## 2022-12-20 DIAGNOSIS — O30042 Twin pregnancy, dichorionic/diamniotic, second trimester: Secondary | ICD-10-CM

## 2022-12-20 MED ORDER — ASPIRIN 81 MG PO TBEC: 81.0000 mg | DELAYED_RELEASE_TABLET | Freq: Every day | ORAL | 2 refills | Status: DC

## 2022-12-20 NOTE — Patient Instructions (Signed)

## 2022-12-20 NOTE — Progress Notes (Signed)
Subjective:  Sonya [name] is a 22 y.o. G1P0000 at [redacted]w[redacted]d being seen today for ongoing prenatal care.  She is currently monitored for the following issues for this high-risk pregnancy and has Cholelithiasis; MDD (major depressive disorder), recurrent episode, severe ([place]); Supervision of other normal pregnancy, antepartum; and Twin pregnancy, twins dichorionic and diamniotic on their problem list.  Patient reports no complaints.  Contractions: Not present. Vag. Bleeding: None.  Movement: Present. Denies leaking of fluid.   The following portions of the patient's history were reviewed and updated as appropriate: allergies, current medications, past family history, past medical history, past social history, past surgical history and problem list. Problem list updated.  Objective:   Vitals:   12/20/22 1031  BP: 114/75  Pulse: 93  Weight: 135 lb (61.2 kg)    Fetal Status:     Movement: Present     General:  Alert, oriented and cooperative. Patient is in no acute distress.  Skin: Skin is warm and dry. No rash noted.   Cardiovascular: Normal heart rate noted  Respiratory: Normal respiratory effort, no problems with respiration noted  Abdomen: Soft, gravid, appropriate for gestational age. Pain/Pressure: Absent     Pelvic:  Cervical exam deferred        Extremities: Normal range of motion.     Mental Status: Normal mood and affect. Normal behavior. Normal judgment and thought content.   Urinalysis:      Assessment and Plan:  Pregnancy: G1P0000 at [redacted]w[redacted]d  1. Supervision of other normal pregnancy, antepartum Stable  2. Dichorionic diamniotic twin pregnancy in second trimester Stable Anatomy scan next week Advised to start BASA qd  Preterm labor symptoms and general obstetric precautions including but not limited to vaginal bleeding, contractions, leaking of fluid and fetal movement were reviewed in detail with the patient. Please refer to After Visit Summary for other counseling  recommendations.  Return in about 4 weeks (around 01/17/2023) for OB visit, face to face, MD only.   Chancy Milroy, MD

## 2022-12-25 ENCOUNTER — Ambulatory Visit: Payer: Medicaid Other | Admitting: *Deleted

## 2022-12-25 ENCOUNTER — Ambulatory Visit: Payer: Medicaid Other | Attending: Maternal & Fetal Medicine

## 2022-12-25 VITALS — BP 114/71 | HR 91

## 2022-12-25 DIAGNOSIS — Z3480 Encounter for supervision of other normal pregnancy, unspecified trimester: Secondary | ICD-10-CM

## 2022-12-25 DIAGNOSIS — Z363 Encounter for antenatal screening for malformations: Secondary | ICD-10-CM | POA: Insufficient documentation

## 2022-12-25 DIAGNOSIS — O30042 Twin pregnancy, dichorionic/diamniotic, second trimester: Secondary | ICD-10-CM | POA: Diagnosis not present

## 2022-12-25 DIAGNOSIS — Z3A19 19 weeks gestation of pregnancy: Secondary | ICD-10-CM | POA: Diagnosis not present

## 2022-12-25 DIAGNOSIS — O30041 Twin pregnancy, dichorionic/diamniotic, first trimester: Secondary | ICD-10-CM

## 2022-12-26 ENCOUNTER — Other Ambulatory Visit: Payer: Self-pay | Admitting: *Deleted

## 2022-12-26 DIAGNOSIS — O30042 Twin pregnancy, dichorionic/diamniotic, second trimester: Secondary | ICD-10-CM

## 2022-12-26 DIAGNOSIS — Z362 Encounter for other antenatal screening follow-up: Secondary | ICD-10-CM

## 2023-01-15 ENCOUNTER — Inpatient Hospital Stay (HOSPITAL_BASED_OUTPATIENT_CLINIC_OR_DEPARTMENT_OTHER): Payer: Medicaid Other

## 2023-01-15 ENCOUNTER — Encounter (HOSPITAL_COMMUNITY): Payer: Self-pay | Admitting: Obstetrics and Gynecology

## 2023-01-15 ENCOUNTER — Inpatient Hospital Stay (HOSPITAL_COMMUNITY)
Admission: AD | Admit: 2023-01-15 | Discharge: 2023-01-15 | Disposition: A | Discharge status: Home / Self Care | Payer: Medicaid Other | Attending: Obstetrics and Gynecology | Admitting: Obstetrics and Gynecology

## 2023-01-15 ENCOUNTER — Other Ambulatory Visit: Payer: Self-pay

## 2023-01-15 DIAGNOSIS — Z3A22 22 weeks gestation of pregnancy: Secondary | ICD-10-CM | POA: Diagnosis not present

## 2023-01-15 DIAGNOSIS — Z3480 Encounter for supervision of other normal pregnancy, unspecified trimester: Secondary | ICD-10-CM

## 2023-01-15 DIAGNOSIS — O26892 Other specified pregnancy related conditions, second trimester: Secondary | ICD-10-CM | POA: Insufficient documentation

## 2023-01-15 DIAGNOSIS — R102 Pelvic and perineal pain: Secondary | ICD-10-CM | POA: Insufficient documentation

## 2023-01-15 DIAGNOSIS — Z3492 Encounter for supervision of normal pregnancy, unspecified, second trimester: Secondary | ICD-10-CM | POA: Diagnosis not present

## 2023-01-15 DIAGNOSIS — N898 Other specified noninflammatory disorders of vagina: Secondary | ICD-10-CM | POA: Diagnosis present

## 2023-01-15 DIAGNOSIS — O30042 Twin pregnancy, dichorionic/diamniotic, second trimester: Secondary | ICD-10-CM | POA: Insufficient documentation

## 2023-01-15 LAB — URINALYSIS, ROUTINE W REFLEX MICROSCOPIC
Bilirubin Urine: NEGATIVE
Glucose, UA: NEGATIVE mg/dL
Hgb urine dipstick: NEGATIVE
Ketones, ur: NEGATIVE mg/dL
Leukocytes,Ua: NEGATIVE
Nitrite: NEGATIVE
Protein, ur: NEGATIVE mg/dL
Specific Gravity, Urine: 1.017 (ref 1.005–1.030)
pH: 6 (ref 5.0–8.0)

## 2023-01-15 LAB — WET PREP, GENITAL
Clue Cells Wet Prep HPF POC: NONE SEEN
Sperm: NONE SEEN
Trich, Wet Prep: NONE SEEN
WBC, Wet Prep HPF POC: >=10 — AB (ref <10)
Yeast Wet Prep HPF POC: NONE SEEN

## 2023-01-15 LAB — AMNISURE RUPTURE OF MEMBRANE (ROM) NOT AT ARMC: Amnisure ROM: NEGATIVE

## 2023-01-15 NOTE — MAU Note (Signed)
Sonya [name] is a 22 y.o. at [redacted]w[redacted]d here in MAU reporting: she's having intermittent pelvic pressure and LOF since the beginning  of January.  Reports today the pelvic pressure is constant and LOF is clear but when dries onto crotch of panties it's white.  Denies VB.  Endorses +FM. LMP: NA Onset of complaint: beginning of January Pain score: 7 Vitals:   01/15/23 1149  BP: (!) 113/53  Pulse: (!) 104  Resp: 18  Temp: 98.1 F (36.7 C)  SpO2: 100%     FHT: Twin gestation, unable to determine if FHT x2 Lab orders placed from triage:   UA

## 2023-01-15 NOTE — Discharge Instructions (Signed)
Round Ligament Pain during Pregnancy Many women will experience a type of pain referred to as "round ligament pain" during their pregnancy. This is associated with abdominal pain or discomfort. Since any type of abdominal pain during pregnancy can be disconcerting, it is important to talk about round ligament pain to relieve any anxiety or fears you may have regarding the symptoms you are feeling. Round ligament pain is due to normal changes that take place in the body during pregnancy. It is caused by stretching of the round ligaments attached to the uterus. More commonly it occurs on the right side of the pelvis. Round Ligament: An Overview Typically in the non-pregnant state the uterus is about the size of an apple or pear. There are thick ligaments which hold the uterus in place in the abdomen, referred to as round ligaments. During pregnancy, your uterus will expand in size and weight, and the ligaments supporting it will have to stretch, becoming longer and thinner. As these ligaments pull and tug they may irritate nearby nerve fibers, which causes pain. The severity of the pain in some cases can seem extreme. Some common symptoms of round ligament pain include:  Ligament spasms or contractions/cramps that trigger a sharp pain typically on the right side of the abdomen.  Pain upon waking or suddenly rolling over in your sleep.  Pain in the abdomen that is sharp brought on by exercise or other vigorous activity. Similar Problems Round ligament pain is often mistaken for other medical conditions because the symptoms are similar. Acute abdominal pain during pregnancy may also be a sign of other conditions including:  Abdominal cramps - Some abdominal pain is simply caused by change in bowel habits associated with pregnancy. Gas is a common problem that can cause sharp, shooting pain. You should always seek out medical care if your pain is accompanied by fever, chills, pain  upon urination or if you have difficulty walking. Further exams and tests will be conducted to ensure that you do not have a more serious condition. It is not uncommon for women with lower abdominal pain to have a urinary tract infection, thus you may also be asked for a urine sample. Treatment If all other conditions are ruled out you can treat your round ligament pain relatively easily. You may be advised to take some acetaminophen (Tylenol) to reduce the severity of any persistent pain and asked to reduce your activity level. You can apply a heating pad to the area of pain or take a warm bath. Lying on the opposite side of the pain may help as well. Most women will find relief from round ligament pain simply by altering their daily routines slightly. The good news is round ligament pain will disappear completely once you have given birth to your child!

## 2023-01-15 NOTE — MAU Provider Note (Signed)
History     CSN: [id number]  Arrival date and time: 01/15/23 1124   Event Date/Time   First Provider Initiated Contact with Patient 01/15/23 1217      Chief Complaint  Patient presents with   Pelvic Pain   Rupture of Membranes   HPI Sonya [name] is a 22 y.o. G1P0000 at [redacted]w[redacted]d with di-di twin gestation who presents to MAU with chief complaint of leaking of fluid. This is a recurrent problem onset in early January but worsening to almost continuous sense of leaking of fluid in the past few days. She reports seeing dried fluid on her underwear.  Patient also reports lower abdominal "pressure". Pain score is 6/10. She denies aggravating or alleviating factors. She has not taken medication or tried other treatments for this complaint.  She denies contractions vaginal bleeding, leaking of fluid, decreased fetal movement, fever, falls, or recent illness.    OB History     Gravida  1   Para  0   Term  0   Preterm  0   AB  0   Living  0      SAB  0   IAB  0   Ectopic  0   Multiple  0   Live Births  0           Past Medical History:  Diagnosis Date   ADHD (attention deficit hyperactivity disorder)    no meds currently   Anxiety    History of seasonal allergies     Past Surgical History:  Procedure Laterality Date   CHOLECYSTECTOMY N/A 01/29/2018   Procedure: LAPAROSCOPIC CHOLECYSTECTOMY WITH INTRAOPERATIVE CHOLANGIOGRAM;  Surgeon: Stanford Scotland, MD;  Location: [place];  Service: Pediatrics;  Laterality: N/A;    Family History  Problem Relation Age of Onset   Epilepsy Mother    Schizophrenia Father    Heart disease Brother    Asthma Brother    Epilepsy Brother    Hypertension Maternal Grandfather    Diabetes Maternal Grandfather    Cancer Paternal Grandfather     Social History   Tobacco Use   Smoking status: Never   Smokeless tobacco: Never  Vaping Use   Vaping Use: Never used  Substance Use Topics   Alcohol use: Not Currently   Drug  use: Not Currently    Types: Marijuana    Comment: last smoked 2 weeks ago (early October 2023)    Allergies: No Known Allergies  Medications Prior to Admission  Medication Sig Dispense Refill Last Dose   aspirin EC 81 MG tablet Take 1 tablet (81 mg total) by mouth daily. Take after 12 weeks for prevention of preeclampsia later in pregnancy 300 tablet 2 Past Week   Prenatal 28-0.8 MG TABS Take 1 tablet by mouth daily. 30 tablet 12 01/15/2023   Blood Pressure Monitoring (BLOOD PRESSURE KIT) DEVI 1 Device by Does not apply route once a week. 1 each 0    scopolamine (TRANSDERM-SCOP) 1 MG/3DAYS Place 1 patch (1.5 mg total) onto the skin every 3 (three) days. 10 patch 1     Review of Systems  Gastrointestinal:  Positive for abdominal pain.  Genitourinary:  Positive for vaginal discharge.  All other systems reviewed and are negative.  Physical Exam   Blood pressure (!) 104/51, pulse (!) 114, temperature 98.1 F (36.7 C), temperature source Oral, resp. rate 18, height 5\' 6"  (1.676 m), weight 66 kg, last menstrual period 08/14/2022, SpO2 100 %.  Physical Exam Vitals and nursing  note reviewed. Exam conducted with a chaperone present.  Constitutional:      General: She is not in acute distress.    Appearance: Normal appearance. She is not ill-appearing.  Cardiovascular:     Rate and Rhythm: Normal rate.  Pulmonary:     Effort: Pulmonary effort is normal.  Abdominal:     Tenderness: There is no abdominal tenderness.     Comments: Gravid  Genitourinary:    Comments: Pelvic exam: External genitalia normal, vaginal walls pink and well rugated, cervix visually closed, no lesions noted.    Skin:    Capillary Refill: Capillary refill takes less than 2 seconds.  Neurological:     Mental Status: She is alert and oriented to person, place, and time.  Psychiatric:        Mood and Affect: Mood normal.        Behavior: Behavior normal.        Thought Content: Thought content normal.         Judgment: Judgment normal.     MAU Course  Procedures  MDM Orders Placed This Encounter  Procedures   Wet prep, genital   [place] MFM OB Limited   Amnisure rupture of membrane (rom)not at [facility]   Urinalysis, Routine w reflex microscopic -Urine, Clean Catch   Discharge patient   Patient Vitals for the past 24 hrs:  BP Temp Temp src Pulse Resp SpO2 Height Weight  01/15/23 1315 (!) 109/55 -- -- 96 -- -- -- --  01/15/23 1215 (!) 104/51 -- -- (!) 114 -- 100 % -- --  01/15/23 1149 (!) 113/53 98.1 F (36.7 C) Oral (!) 104 18 100 % -- --  01/15/23 1144 -- -- -- -- -- -- 5\' 6"  (1.676 m) 66 kg   Results for orders placed or performed during the hospital encounter of 01/15/23 (from the past 24 hour(s))  Urinalysis, Routine w reflex microscopic -Urine, Clean Catch     Status: None   Collection Time: 01/15/23 12:28 PM  Result Value Ref Range   Color, Urine YELLOW YELLOW   APPearance CLEAR CLEAR   Specific Gravity, Urine 1.017 1.005 - 1.030   pH 6.0 5.0 - 8.0   Glucose, UA NEGATIVE NEGATIVE mg/dL   Hgb urine dipstick NEGATIVE NEGATIVE   Bilirubin Urine NEGATIVE NEGATIVE   Ketones, ur NEGATIVE NEGATIVE mg/dL   Protein, ur NEGATIVE NEGATIVE mg/dL   Nitrite NEGATIVE NEGATIVE   Leukocytes,Ua NEGATIVE NEGATIVE  Amnisure rupture of membrane (rom)not at [facility]     Status: None   Collection Time: 01/15/23 12:30 PM  Result Value Ref Range   Amnisure ROM NEGATIVE   Wet prep, genital     Status: Abnormal   Collection Time: 01/15/23 12:30 PM  Result Value Ref Range   Yeast Wet Prep HPF POC NONE SEEN NONE SEEN   Trich, Wet Prep NONE SEEN NONE SEEN   Clue Cells Wet Prep HPF POC NONE SEEN NONE SEEN   WBC, Wet Prep HPF POC >=10 (A) <10   Sperm NONE SEEN    [place] MFM OB Limited  Result Date: 01/15/2023 ----------------------------------------------------------------------  OBSTETRICS REPORT                       (Signed Final 01/15/2023 05:02 pm)  ---------------------------------------------------------------------- Patient Info  ID #:       [id number]  D.O.B.:  2001/06/02 (21 yrs)  Name:       Sonya [name]                 Visit Date: 01/15/2023 12:52 pm ---------------------------------------------------------------------- Performed By  Attending:        Valeda Malm DO       Secondary Phy.:   [facility] MAU/Triage  Performed By:     Berlinda Last          [facility]                    [place]  Referred By:      Darlina Rumpf ---------------------------------------------------------------------- Orders  #  Description                           Code        Ordered By  1  [place] MFM OB LIMITED                     [id number]    Mallie Snooks ----------------------------------------------------------------------  #  Order #                     Accession #                Episode #  1  [id number]                   [id number]                 [id number] ---------------------------------------------------------------------- Indications  Vaginal discharge during pregnancy in          O26.892  second trimester  [redacted] weeks gestation of pregnancy                Z3A.48  Twin pregnancy, di/di, second trimester        O30.042 ---------------------------------------------------------------------- Fetal Evaluation (Fetus A)  Num Of Fetuses:         2  Fetal Heart Rate(bpm):  150  Cardiac Activity:       Observed  Fetal Lie:              Maternal left side  Presentation:           Cephalic  Placenta:               Posterior  P. Cord Insertion:      Not well visualized  Membrane Desc:      Dividing Membrane seen - Dichorionic.  Amniotic Fluid  AFI FV:      Within normal limits  Largest Pocket(cm)                              6.4  Comment:    No placental abruption or previa identified.  ---------------------------------------------------------------------- OB History  Gravidity:    1 ---------------------------------------------------------------------- Gestational Age (Fetus A)  LMP:           22w 0d        Date:  08/14/22                  EDD:   05/21/23  Best:          Kathrene Bongo 0d     Det. By:  U/S C R L Fetus A        EDD:   05/21/23                                      (10/18/22) ---------------------------------------------------------------------- Anatomy (Fetus A)  Cranium:               Appears normal         Stomach:                Appears normal, left                                                                        sided  Ventricles:            Appears normal         Kidneys:                Appear normal  Diaphragm:             Appears normal         Bladder:                Appears normal ---------------------------------------------------------------------- Fetal Evaluation (Fetus B)  Num Of Fetuses:         2  Fetal Heart Rate(bpm):  168  Cardiac Activity:       Observed  Fetal Lie:              Maternal right side  Presentation:           Breech  Placenta:               Posterior  P. Cord Insertion:      Visualized  Membrane Desc:      Dividing Membrane seen - Dichorionic.  Amniotic Fluid  AFI FV:      Within normal limits                              Largest Pocket(cm)                              5.4  Comment:    No placental abruption or previa identified. ---------------------------------------------------------------------- Gestational Age (Fetus B)  LMP:           22w 0d  Date:  08/14/22                  EDD:   05/21/23  Best:          Maudie Mercury 0d     Det. By:  U/S C R L Fetus A        EDD:   05/21/23                                      (10/18/22) ---------------------------------------------------------------------- Anatomy (Fetus B)  Cranium:               Appears normal         Stomach:                Appears normal, left                                                                         sided  Ventricles:            Appears normal         Kidneys:                Appear normal  Diaphragm:             Appears normal         Bladder:                Appears normal ---------------------------------------------------------------------- Cervix Uterus Adnexa  Cervix  Length:            3.5  cm.  Normal appearance by transabdominal scan  Uterus  No abnormality visualized.  Right Ovary  Within normal limits.  Left Ovary  Within normal limits.  Adnexa  No abnormality visualized ---------------------------------------------------------------------- Comments  Hospital Ultrasound  22w 0d who presents to the MAU for pelvic pressure and  difficulty finding fetal heart tones. EDD: 05/21/2023 by U/S C  R L Fetus A  (10/18/22).  Sonographic findings  Twin (di-di) intrauterine pregnancy.  Fetal cardiac activity: A: Observed, B: Observed and appears  normal.  Presentation: A: Cephalic, B: Breech.  Limited fetal anatomy appears normal.  Amniotic fluid volume: A: Within normal limits, B: Within  normal limits. MVP: A: 6.4, B: 5.4 cm.  Placenta: A: Posterior, B: Posterior. There is no sonographic  evidence of bleeding.  Appropriate movement and tone for gestational age.  Recommendations  - While there is no sonogrpahic evidence of placental  bleeding, placental abruption is a clinical diagnosis and  ultrasound findings of placental beeding are seen in less than  25% of cases  - Continue clinical management per [facility] provider  This was a limited ultrasound with a remote read. If an official  MFM consult is requested for any reason please call/place an  order in Epic. ----------------------------------------------------------------------                  Braxton Feathers, DO Electronically Signed Final Report   01/15/2023 05:02 pm ----------------------------------------------------------------------    Assessment and Plan  --22 y.o. G1P0000 with Mercie Eon twin gestation at [redacted]w[redacted]d  --Intact amniotic sac --No concerning  findings on formal MFM OB Limited --Pelvic  pain in second trimester --Pain medication declined by patient --Can consider maternity belt --Discharge home in stable condition with return precautions  F/U --Patient's next appointment at [place] is 01/17/2023  Darlina Rumpf, [place], MSN, CNM

## 2023-01-16 LAB — GC/CHLAMYDIA PROBE AMP (~~LOC~~) NOT AT ARMC
Chlamydia: NEGATIVE
Comment: NEGATIVE
Comment: NORMAL
Neisseria Gonorrhea: NEGATIVE

## 2023-01-17 ENCOUNTER — Ambulatory Visit (INDEPENDENT_AMBULATORY_CARE_PROVIDER_SITE_OTHER): Payer: Medicaid Other | Admitting: Obstetrics and Gynecology

## 2023-01-17 VITALS — BP 97/63 | HR 90 | Wt 145.0 lb

## 2023-01-17 DIAGNOSIS — O30042 Twin pregnancy, dichorionic/diamniotic, second trimester: Secondary | ICD-10-CM

## 2023-01-17 DIAGNOSIS — Z3A22 22 weeks gestation of pregnancy: Secondary | ICD-10-CM

## 2023-01-17 DIAGNOSIS — Z3480 Encounter for supervision of other normal pregnancy, unspecified trimester: Secondary | ICD-10-CM

## 2023-01-17 NOTE — Progress Notes (Signed)
ROB/TWINS

## 2023-01-17 NOTE — Progress Notes (Signed)
   PRENATAL VISIT NOTE  Subjective:  Sonya [name] is a 22 y.o. G1P0000 at [redacted]w[redacted]d being seen today for ongoing prenatal care.  She is currently monitored for the following issues for this high-risk pregnancy and has Cholelithiasis; MDD (major depressive disorder), recurrent episode, severe ([place]); Supervision of other normal pregnancy, antepartum; and Twin pregnancy, twins dichorionic and diamniotic on their problem list.  Patient doing well with no acute concerns today. She reports no complaints.  Contractions: Not present. Vag. Bleeding: None.  Movement: Present. Denies leaking of fluid. Recently seen in MAU   The following portions of the patient's history were reviewed and updated as appropriate: allergies, current medications, past family history, past medical history, past social history, past surgical history and problem list. Problem list updated.  Objective:   Vitals:   01/17/23 1116  BP: 97/63  Pulse: 90  Weight: 145 lb (65.8 kg)    Fetal Status: Fetal Heart Rate (bpm): A:157 / B:153 Fundal Height: 26 cm Movement: Present     General:  Alert, oriented and cooperative. Patient is in no acute distress.  Skin: Skin is warm and dry. No rash noted.   Cardiovascular: Normal heart rate noted  Respiratory: Normal respiratory effort, no problems with respiration noted  Abdomen: Soft, gravid, appropriate for gestational age.  Pain/Pressure: Present     Pelvic: Cervical exam deferred        Extremities: Normal range of motion.  Edema: None  Mental Status:  Normal mood and affect. Normal behavior. Normal judgment and thought content.   Assessment and Plan:  Pregnancy: G1P0000 at [redacted]w[redacted]d  1. Supervision of other normal pregnancy, antepartum Continue routine prenatal care  2. [redacted] weeks gestation of pregnancy   3. Dichorionic diamniotic twin pregnancy in second trimester Pt has MFM scan 01/29/23, no reports of premature ctx  Preterm labor symptoms and general obstetric precautions  including but not limited to vaginal bleeding, contractions, leaking of fluid and fetal movement were reviewed in detail with the patient.  Please refer to After Visit Summary for other counseling recommendations.   Return in about 4 weeks (around 02/14/2023) for [facility], in person.   Lynnda Shields, MD Faculty Attending Center for [facility]

## 2023-01-29 ENCOUNTER — Ambulatory Visit: Payer: Medicaid Other | Attending: Maternal & Fetal Medicine

## 2023-01-29 ENCOUNTER — Ambulatory Visit: Payer: Medicaid Other

## 2023-01-29 VITALS — BP 101/56 | HR 98

## 2023-01-29 DIAGNOSIS — Z3480 Encounter for supervision of other normal pregnancy, unspecified trimester: Secondary | ICD-10-CM | POA: Diagnosis present

## 2023-01-29 DIAGNOSIS — Z362 Encounter for other antenatal screening follow-up: Secondary | ICD-10-CM | POA: Diagnosis present

## 2023-01-29 DIAGNOSIS — Z148 Genetic carrier of other disease: Secondary | ICD-10-CM

## 2023-01-29 DIAGNOSIS — O26892 Other specified pregnancy related conditions, second trimester: Secondary | ICD-10-CM | POA: Diagnosis not present

## 2023-01-29 DIAGNOSIS — N898 Other specified noninflammatory disorders of vagina: Secondary | ICD-10-CM | POA: Diagnosis not present

## 2023-01-29 DIAGNOSIS — Z3A25 25 weeks gestation of pregnancy: Secondary | ICD-10-CM | POA: Insufficient documentation

## 2023-01-29 DIAGNOSIS — O30042 Twin pregnancy, dichorionic/diamniotic, second trimester: Secondary | ICD-10-CM | POA: Diagnosis present

## 2023-01-29 DIAGNOSIS — Z3A24 24 weeks gestation of pregnancy: Secondary | ICD-10-CM | POA: Diagnosis not present

## 2023-02-12 ENCOUNTER — Inpatient Hospital Stay (HOSPITAL_COMMUNITY): Payer: Medicaid Other

## 2023-02-12 ENCOUNTER — Inpatient Hospital Stay (HOSPITAL_COMMUNITY)
Admission: AD | Admit: 2023-02-12 | Discharge: 2023-02-12 | Disposition: A | Discharge status: Home / Self Care | Payer: Medicaid Other | Attending: Family Medicine | Admitting: Family Medicine

## 2023-02-12 ENCOUNTER — Encounter (HOSPITAL_COMMUNITY): Payer: Self-pay | Admitting: Family Medicine

## 2023-02-12 DIAGNOSIS — Z3A26 26 weeks gestation of pregnancy: Secondary | ICD-10-CM | POA: Diagnosis not present

## 2023-02-12 DIAGNOSIS — R1011 Right upper quadrant pain: Secondary | ICD-10-CM | POA: Diagnosis present

## 2023-02-12 DIAGNOSIS — O26892 Other specified pregnancy related conditions, second trimester: Secondary | ICD-10-CM | POA: Insufficient documentation

## 2023-02-12 DIAGNOSIS — O30002 Twin pregnancy, unspecified number of placenta and unspecified number of amniotic sacs, second trimester: Secondary | ICD-10-CM | POA: Insufficient documentation

## 2023-02-12 DIAGNOSIS — Z3492 Encounter for supervision of normal pregnancy, unspecified, second trimester: Secondary | ICD-10-CM

## 2023-02-12 LAB — HEPATIC FUNCTION PANEL
ALT: 9 U/L (ref 0–44)
AST: 17 U/L (ref 15–41)
Albumin: 3 g/dL — ABNORMAL LOW (ref 3.5–5.0)
Alkaline Phosphatase: 73 U/L (ref 38–126)
Bilirubin, Direct: <0.1 mg/dL (ref 0.0–0.2)
Total Bilirubin: 0.4 mg/dL (ref 0.3–1.2)
Total Protein: 6.4 g/dL — ABNORMAL LOW (ref 6.5–8.1)

## 2023-02-12 LAB — CBC WITH DIFFERENTIAL/PLATELET
Abs Immature Granulocytes: 0.13 10*3/uL — ABNORMAL HIGH (ref 0.00–0.07)
Basophils Absolute: 0 10*3/uL (ref 0.0–0.1)
Basophils Relative: 0 %
Eosinophils Absolute: 0.1 10*3/uL (ref 0.0–0.5)
Eosinophils Relative: 1 %
HCT: 29.8 % — ABNORMAL LOW (ref 36.0–46.0)
Hemoglobin: 9.6 g/dL — ABNORMAL LOW (ref 12.0–15.0)
Immature Granulocytes: 2 %
Lymphocytes Relative: 10 %
Lymphs Abs: 0.9 10*3/uL (ref 0.7–4.0)
MCH: 28.2 pg (ref 26.0–34.0)
MCHC: 32.2 g/dL (ref 30.0–36.0)
MCV: 87.4 fL (ref 80.0–100.0)
Monocytes Absolute: 0.5 10*3/uL (ref 0.1–1.0)
Monocytes Relative: 6 %
Neutro Abs: 6.7 10*3/uL (ref 1.7–7.7)
Neutrophils Relative %: 81 %
Platelets: 268 10*3/uL (ref 150–400)
RBC: 3.41 MIL/uL — ABNORMAL LOW (ref 3.87–5.11)
RDW: 13.1 % (ref 11.5–15.5)
WBC: 8.3 10*3/uL (ref 4.0–10.5)
nRBC: 0 % (ref 0.0–0.2)

## 2023-02-12 LAB — URINALYSIS, ROUTINE W REFLEX MICROSCOPIC
Bilirubin Urine: NEGATIVE
Glucose, UA: NEGATIVE mg/dL
Hgb urine dipstick: NEGATIVE
Ketones, ur: NEGATIVE mg/dL
Nitrite: NEGATIVE
Protein, ur: NEGATIVE mg/dL
Specific Gravity, Urine: 1.01 (ref 1.005–1.030)
pH: 7 (ref 5.0–8.0)

## 2023-02-12 LAB — URINALYSIS, MICROSCOPIC (REFLEX)

## 2023-02-12 MED ORDER — ACETAMINOPHEN 500 MG PO TABS
1000.0000 mg | ORAL_TABLET | Freq: Once | ORAL | Status: AC
  Administered 2023-02-12: 1000 mg via ORAL
  Filled 2023-02-12: qty 2

## 2023-02-12 MED ORDER — CYCLOBENZAPRINE HCL 5 MG PO TABS
10.0000 mg | ORAL_TABLET | Freq: Once | ORAL | Status: AC
  Administered 2023-02-12: 10 mg via ORAL
  Filled 2023-02-12: qty 2

## 2023-02-12 MED ORDER — CYCLOBENZAPRINE HCL 10 MG PO TABS: 10.0000 mg | ORAL_TABLET | Freq: Two times a day (BID) | ORAL | 0 refills | Status: DC | PRN

## 2023-02-12 NOTE — MAU Provider Note (Signed)
History     CSN: [id number]  Arrival date and time: 02/12/23 1520   Event Date/Time   First Provider Initiated Contact with Patient 02/12/23 1555      Chief Complaint  Patient presents with   Abdominal Pain   Sonya [name] , a  22 y.o. G1P0000 at 28w0dpresents to MAU via EMS with complaints of right upper quadrant pain that started about 2 hours ago. Patient states she was at work and started feeling lightheaded and dizzy and sat down. She states that upon standing her began to feel a "sharp stabbing pain" in her right upper abdomen that shoots down her right side. She states it "feels better with lying down" but is still there. She denies nausea and vomiting, diarrhea and constipation. Last BM was day before yesterday. She denies attempting to relieve pain with medication. She denies abnormal vaginal discharge and urinary symptoms. Patient states that her abdomen is tender to the touch. She states she does not have a gallbladder.           OB History     Gravida  1   Para  0   Term  0   Preterm  0   AB  0   Living  0      SAB  0   IAB  0   Ectopic  0   Multiple  0   Live Births  0           Past Medical History:  Diagnosis Date   ADHD (attention deficit hyperactivity disorder)    no meds currently   Anxiety    History of seasonal allergies     Past Surgical History:  Procedure Laterality Date   CHOLECYSTECTOMY N/A 01/29/2018   Procedure: LAPAROSCOPIC CHOLECYSTECTOMY WITH INTRAOPERATIVE CHOLANGIOGRAM;  Surgeon: AStanford Scotland MD;  [place]  Service: Pediatrics;  Laterality: N/A;    Family History  Problem Relation Age of Onset   Epilepsy Mother    Schizophrenia Father    Heart disease Brother    Asthma Brother    Epilepsy Brother    Hypertension Maternal Grandfather    Diabetes Maternal Grandfather    Cancer Paternal Grandfather     Social History   Tobacco Use   Smoking status: Never   Smokeless tobacco: Never  Vaping  Use   Vaping Use: Never used  Substance Use Topics   Alcohol use: Not Currently   Drug use: Not Currently    Types: Marijuana    Comment: last smoked 2 weeks ago (early October 2023)    Allergies: No Known Allergies  Medications Prior to Admission  Medication Sig Dispense Refill Last Dose   aspirin EC 81 MG tablet Take 1 tablet (81 mg total) by mouth daily. Take after 12 weeks for prevention of preeclampsia later in pregnancy 300 tablet 2 02/11/2023   Prenatal 28-0.8 MG TABS Take 1 tablet by mouth daily. 30 tablet 12 02/12/2023   Blood Pressure Monitoring (BLOOD PRESSURE KIT) DEVI 1 Device by Does not apply route once a week. (Patient not taking: Reported on 01/29/2023) 1 each 0    scopolamine (TRANSDERM-SCOP) 1 MG/3DAYS Place 1 patch (1.5 mg total) onto the skin every 3 (three) days. (Patient not taking: Reported on 01/29/2023) 10 patch 1     Review of Systems  Constitutional:  Positive for fatigue. Negative for chills and fever.  Eyes:  Negative for pain and visual disturbance.  Respiratory:  Negative for apnea, shortness of breath  and wheezing.   Cardiovascular:  Negative for chest pain and palpitations.  Gastrointestinal:  Positive for abdominal pain. Negative for constipation, diarrhea, nausea and vomiting.  Genitourinary:  Negative for difficulty urinating, dysuria, pelvic pain, vaginal bleeding, vaginal discharge and vaginal pain.  Musculoskeletal:  Negative for back pain.  Neurological:  Negative for seizures, weakness and headaches.  Psychiatric/Behavioral:  Negative for suicidal ideas.    Physical Exam   Blood pressure (!) 108/58, pulse 91, temperature 98.2 F (36.8 C), resp. rate 18, last menstrual period 08/14/2022.  Physical Exam Vitals and nursing note reviewed.  Constitutional:      General: She is not in acute distress.    Appearance: Normal appearance.  HENT:     Head: Normocephalic.  Cardiovascular:     Rate and Rhythm: Normal rate and regular rhythm.   Pulmonary:     Effort: Pulmonary effort is normal.  Abdominal:     Palpations: Abdomen is soft.     Tenderness: There is abdominal tenderness in the right upper quadrant. There is no right CVA tenderness or left CVA tenderness.     Comments: Fetal parts noted in area where patient complains of pain.   Musculoskeletal:     Cervical back: Normal range of motion.  Skin:    General: Skin is warm and dry.  Neurological:     Mental Status: She is alert and oriented to person, place, and time.  Psychiatric:        Mood and Affect: Mood normal.    FHTA: 145 bpm with moderate variability. Accels present no decels.   FHTB: 150bpm with moderate variability. Accels present no decels  [place]: occasional contractions (patient unaware)   MAU Course  Procedures Orders Placed This Encounter  Procedures   US ABDOMEN RUQ W/ELASTOGRAPHY   Hepatic function panel   CBC with Differential/Platelet   Urinalysis, Routine w reflex microscopic -Urine, Clean Catch   US Abdomen Limited RUQ (LIVER/GB)  Result Date: 02/12/2023 CLINICAL DATA:  [id number] RUQ pain 151471 EXAM: ULTRASOUND ABDOMEN LIMITED COMPARISON:  01/01/2018 FINDINGS: The liver demonstrates normal parenchymal echogenicity and homogeneous texture without focal hepatic parenchymal lesions or intrahepatic ductal dilatation. Hepatopetal portal vein. Patient is status post cholecystectomy.  CBD measured 0.2cm. IMPRESSION: Unremarkable post cholecystectomy examination of the right upper quadrant. Electronically Signed   By: Sammie Bench M.D.   On: 02/12/2023 16:58     MDM - Liver US appears normal. - WBC normal  - Liver Enzymes normal.  - Pain description more consistent with fetal movement and sore muscle.  - Plan for discharge   Assessment and Plan   1. Movement of fetus present during pregnancy in second trimester   2. Twin gestation in second trimester, unspecified multiple gestation type   3. [redacted] weeks gestation of pregnancy   4. Right  upper quadrant abdominal pain    - Discussed normalcy of sore muscles from fetal movement. Reviewed comfort measures and at home management for relief.  - Worsening signs and return precautions reviewed.  - FHTA and FHTB appropriate for gestational age at time of discharge  - Preterm labor symptoms reviewed  - Patient discharged home in stable condition and may return to MAU as needed .  Jacquiline Doe, MSN CNM  02/12/2023, 3:55 PM

## 2023-02-12 NOTE — MAU Note (Signed)
.  Sonya [name] is a 22 y.o. at 36w0dhere in MAU reporting: EMS arrival . Pt was at work on her feet all afternoon and started to feel a little dizzy. Sat down and then started having a sharp pian on her left side radiating up to her rib cage . Good fetal movement felt. Denioes any vag bleeding or leaking.  LMP:  Onset of complaint: 1 hr ago Pain score: 8 Vitals:   02/12/23 1526  BP: (!) 108/58  Pulse: 91  Resp: 18  Temp: 98.2 F (36.8 C)     FHT:150 Lab orders placed from triage:

## 2023-02-14 ENCOUNTER — Ambulatory Visit (INDEPENDENT_AMBULATORY_CARE_PROVIDER_SITE_OTHER): Payer: Medicaid Other | Admitting: Obstetrics & Gynecology

## 2023-02-14 VITALS — BP 97/66 | HR 118 | Wt 152.0 lb

## 2023-02-14 DIAGNOSIS — O30042 Twin pregnancy, dichorionic/diamniotic, second trimester: Secondary | ICD-10-CM

## 2023-02-14 DIAGNOSIS — Z3480 Encounter for supervision of other normal pregnancy, unspecified trimester: Secondary | ICD-10-CM

## 2023-02-14 DIAGNOSIS — K8020 Calculus of gallbladder without cholecystitis without obstruction: Secondary | ICD-10-CM

## 2023-02-14 DIAGNOSIS — Z3A26 26 weeks gestation of pregnancy: Secondary | ICD-10-CM

## 2023-02-14 NOTE — Progress Notes (Signed)
   PRENATAL VISIT NOTE  Subjective:  Sonya [name] is a 22 y.o. G1P0000 at 60w2dbeing seen today for ongoing prenatal care.  She is currently monitored for the following issues for this high-risk pregnancy and has Cholelithiasis; MDD (major depressive disorder), recurrent episode, severe ([place]; Supervision of other normal pregnancy, antepartum; and Twin pregnancy, twins dichorionic and diamniotic on their problem list.  Patient reports  pressure .  Contractions: Not present. Vag. Bleeding: None.  Movement: Present. Denies leaking of fluid.   The following portions of the patient's history were reviewed and updated as appropriate: allergies, current medications, past family history, past medical history, past social history, past surgical history and problem list.   Objective:   Vitals:   02/14/23 1122  BP: 97/66  Pulse: (!) 118  Weight: 68.9 kg    Fetal Status: Fetal Heart Rate (bpm): A:154 / B:156   Movement: Present     General:  Alert, oriented and cooperative. Patient is in no acute distress.  Skin: Skin is warm and dry. No rash noted.   Cardiovascular: Normal heart rate noted  Respiratory: Normal respiratory effort, no problems with respiration noted  Abdomen: Soft, gravid, appropriate for gestational age.  Pain/Pressure: Present     Pelvic: Cervical exam deferred        Extremities: Normal range of motion.  Edema: None  Mental Status: Normal mood and affect. Normal behavior. Normal judgment and thought content.   Assessment and Plan:  Pregnancy: G1P0000 at 221w2d. Supervision of other normal pregnancy, antepartum F/u with MFM and Peds cardiology is scheduled  2. Dichorionic diamniotic twin pregnancy in second trimester Growth [place] 2 weeks  3. Calculus of gallbladder without cholecystitis without obstruction S/p cholecystectomy 2019  Preterm labor symptoms and general obstetric precautions including but not limited to vaginal bleeding, contractions, leaking of fluid  and fetal movement were reviewed in detail with the patient. Please refer to After Visit Summary for other counseling recommendations.   Return in about 2 weeks (around 02/28/2023).  Future Appointments  Date Time Provider [place]3/11/2023 10:30 AM [facility]11/2023 10:45 AM [facility] US5 [facility]    JaEmeterio ReeveMD

## 2023-02-14 NOTE — Progress Notes (Signed)
ROB/Twins

## 2023-02-21 ENCOUNTER — Other Ambulatory Visit: Payer: Self-pay | Admitting: *Deleted

## 2023-02-26 ENCOUNTER — Ambulatory Visit: Payer: Medicaid Other | Admitting: *Deleted

## 2023-02-26 ENCOUNTER — Ambulatory Visit: Payer: Medicaid Other | Attending: Maternal & Fetal Medicine

## 2023-02-26 ENCOUNTER — Other Ambulatory Visit: Payer: Self-pay | Admitting: *Deleted

## 2023-02-26 VITALS — BP 110/65 | HR 95

## 2023-02-26 DIAGNOSIS — Z148 Genetic carrier of other disease: Secondary | ICD-10-CM | POA: Diagnosis not present

## 2023-02-26 DIAGNOSIS — Z362 Encounter for other antenatal screening follow-up: Secondary | ICD-10-CM | POA: Diagnosis not present

## 2023-02-26 DIAGNOSIS — Z3480 Encounter for supervision of other normal pregnancy, unspecified trimester: Secondary | ICD-10-CM | POA: Insufficient documentation

## 2023-02-26 DIAGNOSIS — O30042 Twin pregnancy, dichorionic/diamniotic, second trimester: Secondary | ICD-10-CM | POA: Insufficient documentation

## 2023-02-26 DIAGNOSIS — O321XX0 Maternal care for breech presentation, not applicable or unspecified: Secondary | ICD-10-CM | POA: Diagnosis not present

## 2023-02-26 DIAGNOSIS — Z3A38 38 weeks gestation of pregnancy: Secondary | ICD-10-CM | POA: Diagnosis not present

## 2023-02-26 DIAGNOSIS — Z3A28 28 weeks gestation of pregnancy: Secondary | ICD-10-CM

## 2023-02-26 DIAGNOSIS — O285 Abnormal chromosomal and genetic finding on antenatal screening of mother: Secondary | ICD-10-CM

## 2023-02-26 DIAGNOSIS — O30043 Twin pregnancy, dichorionic/diamniotic, third trimester: Secondary | ICD-10-CM

## 2023-02-28 ENCOUNTER — Encounter: Payer: Self-pay | Admitting: Obstetrics

## 2023-02-28 ENCOUNTER — Other Ambulatory Visit: Payer: Medicaid Other

## 2023-02-28 ENCOUNTER — Ambulatory Visit (INDEPENDENT_AMBULATORY_CARE_PROVIDER_SITE_OTHER): Payer: Medicaid Other | Admitting: Obstetrics

## 2023-02-28 VITALS — BP 104/63 | HR 101 | Wt 155.2 lb

## 2023-02-28 DIAGNOSIS — O30049 Twin pregnancy, dichorionic/diamniotic, unspecified trimester: Secondary | ICD-10-CM

## 2023-02-28 DIAGNOSIS — Z3A28 28 weeks gestation of pregnancy: Secondary | ICD-10-CM

## 2023-02-28 DIAGNOSIS — O0993 Supervision of high risk pregnancy, unspecified, third trimester: Secondary | ICD-10-CM

## 2023-02-28 DIAGNOSIS — K8020 Calculus of gallbladder without cholecystitis without obstruction: Secondary | ICD-10-CM

## 2023-02-28 DIAGNOSIS — O0990 Supervision of high risk pregnancy, unspecified, unspecified trimester: Secondary | ICD-10-CM

## 2023-02-28 DIAGNOSIS — O30043 Twin pregnancy, dichorionic/diamniotic, third trimester: Secondary | ICD-10-CM

## 2023-02-28 NOTE — Progress Notes (Signed)
Pt reports fetal movement with some pressure. Glucose test was rescheduled for next visit.

## 2023-02-28 NOTE — Progress Notes (Signed)
Subjective:  Sonya [name] is a 22 y.o. G1P0000 at 54w2dbeing seen today for ongoing prenatal care.  She is currently monitored for the following issues for this high-risk pregnancy and has Cholelithiasis; MDD (major depressive disorder), recurrent episode, severe ([place]; Supervision of other normal pregnancy, antepartum; and Twin pregnancy, twins dichorionic and diamniotic on their problem list.  Patient reports heartburn.  Contractions: Not present. Vag. Bleeding: None.  Movement: Present. Denies leaking of fluid.   The following portions of the patient's history were reviewed and updated as appropriate: allergies, current medications, past family history, past medical history, past social history, past surgical history and problem list. Problem list updated.  Objective:   Vitals:   02/28/23 1012  BP: 104/63  Pulse: (!) 101  Weight: 155 lb 3.2 oz (70.4 kg)    Fetal Status: Fetal Heart Rate (bpm): A:135, B:143   Movement: Present     General:  Alert, oriented and cooperative. Patient is in no acute distress.  Skin: Skin is warm and dry. No rash noted.   Cardiovascular: Normal heart rate noted  Respiratory: Normal respiratory effort, no problems with respiration noted  Abdomen: Soft, gravid, appropriate for gestational age. Pain/Pressure: Present     Pelvic:  Cervical exam deferred        Extremities: Normal range of motion.  Edema: None  Mental Status: Normal mood and affect. Normal behavior. Normal judgment and thought content.   Urinalysis:      Assessment and Plan:  Pregnancy: G1P0000 at 241w2d1. Supervision of high risk pregnancy, antepartum  2. Dichorionic diamniotic twin pregnancy, antepartum  3. Calculus of gallbladder without cholecystitis without obstruction - clinically stable    Preterm labor symptoms and general obstetric precautions including but not limited to vaginal bleeding, contractions, leaking of fluid and fetal movement were reviewed in detail with the  patient. Please refer to After Visit Summary for other counseling recommendations.   [facility]around [facility]14[facility]2023[facility]  HaShelly BombardMD 02/28/23

## 2023-03-04 ENCOUNTER — Other Ambulatory Visit: Payer: Medicaid Other

## 2023-03-06 ENCOUNTER — Other Ambulatory Visit: Payer: Medicaid Other

## 2023-03-06 DIAGNOSIS — O0990 Supervision of high risk pregnancy, unspecified, unspecified trimester: Secondary | ICD-10-CM

## 2023-03-07 LAB — GLUCOSE TOLERANCE, 2 HOURS W/ 1HR
Glucose, 1 hour: 133 mg/dL (ref 70–179)
Glucose, 2 hour: 114 mg/dL (ref 70–152)
Glucose, Fasting: 80 mg/dL (ref 70–91)

## 2023-03-07 LAB — CBC
Hematocrit: 28.5 % — ABNORMAL LOW (ref 34.0–46.6)
Hemoglobin: 9 g/dL — ABNORMAL LOW (ref 11.1–15.9)
MCH: 26.4 pg — ABNORMAL LOW (ref 26.6–33.0)
MCHC: 31.6 g/dL (ref 31.5–35.7)
MCV: 84 fL (ref 79–97)
Platelets: 238 10*3/uL (ref 150–450)
RBC: 3.41 x10E6/uL — ABNORMAL LOW (ref 3.77–5.28)
RDW: 13.1 % (ref 11.7–15.4)
WBC: 7.6 10*3/uL (ref 3.4–10.8)

## 2023-03-07 LAB — HIV ANTIBODY (ROUTINE TESTING W REFLEX): HIV Screen 4th Generation wRfx: NONREACTIVE

## 2023-03-07 LAB — RPR: RPR Ser Ql: NONREACTIVE

## 2023-03-14 ENCOUNTER — Encounter: Payer: Self-pay | Admitting: Obstetrics

## 2023-03-14 ENCOUNTER — Ambulatory Visit (INDEPENDENT_AMBULATORY_CARE_PROVIDER_SITE_OTHER): Payer: Medicaid Other | Admitting: Obstetrics

## 2023-03-14 VITALS — BP 120/72 | HR 94 | Wt 159.2 lb

## 2023-03-14 DIAGNOSIS — O30043 Twin pregnancy, dichorionic/diamniotic, third trimester: Secondary | ICD-10-CM

## 2023-03-14 DIAGNOSIS — O0990 Supervision of high risk pregnancy, unspecified, unspecified trimester: Secondary | ICD-10-CM

## 2023-03-14 DIAGNOSIS — O0993 Supervision of high risk pregnancy, unspecified, third trimester: Secondary | ICD-10-CM

## 2023-03-14 DIAGNOSIS — Z3A30 30 weeks gestation of pregnancy: Secondary | ICD-10-CM

## 2023-03-14 DIAGNOSIS — O30049 Twin pregnancy, dichorionic/diamniotic, unspecified trimester: Secondary | ICD-10-CM

## 2023-03-14 NOTE — Progress Notes (Signed)
Pt presents for ROB visit. Pt c/o pelvic pressure. No other concerns at this time.

## 2023-03-14 NOTE — Progress Notes (Signed)
Subjective:  Sonya [name] is a 22 y.o. G1P0000 at [redacted]w[redacted]d being seen today for ongoing prenatal care.  She is currently monitored for the following issues for this high-risk pregnancy and has Cholelithiasis; MDD (major depressive disorder), recurrent episode, severe ([place]); Supervision of other normal pregnancy, antepartum; and Twin pregnancy, twins dichorionic and diamniotic on their problem list.  Patient reports  pelvic pressure .  Contractions: Not present. Vag. Bleeding: None.  Movement: Present. Denies leaking of fluid.   The following portions of the patient's history were reviewed and updated as appropriate: allergies, current medications, past family history, past medical history, past social history, past surgical history and problem list. Problem list updated.  Objective:   Vitals:   03/14/23 0959  BP: 120/72  Pulse: 94  Weight: 159 lb 3.2 oz (72.2 kg)    Fetal Status: Fetal Heart Rate (bpm): A:149 B:146   Movement: Present     General:  Alert, oriented and cooperative. Patient is in no acute distress.  Skin: Skin is warm and dry. No rash noted.   Cardiovascular: Normal heart rate noted  Respiratory: Normal respiratory effort, no problems with respiration noted  Abdomen: Soft, gravid, appropriate for gestational age. Pain/Pressure: Present     Pelvic:  Cervical exam deferred        Extremities: Normal range of motion.  Edema: None  Mental Status: Normal mood and affect. Normal behavior. Normal judgment and thought content.   Urinalysis:      Assessment and Plan:  Pregnancy: G1P0000 at 100w2d  1. Supervision of high risk pregnancy, antepartum  2. Dichorionic diamniotic twin pregnancy, antepartum   Preterm labor symptoms and general obstetric precautions including but not limited to vaginal bleeding, contractions, leaking of fluid and fetal movement were reviewed in detail with the patient. Please refer to After Visit Summary for other counseling recommendations.    Return in about 2 weeks (around 03/28/2023) for [facility].   Shelly Bombard, MD 03/14/23

## 2023-03-26 ENCOUNTER — Ambulatory Visit: Payer: Medicaid Other | Attending: Maternal & Fetal Medicine

## 2023-03-26 ENCOUNTER — Encounter: Payer: Self-pay | Admitting: *Deleted

## 2023-03-26 ENCOUNTER — Ambulatory Visit: Payer: Medicaid Other | Admitting: *Deleted

## 2023-03-26 VITALS — BP 101/54 | HR 120

## 2023-03-26 DIAGNOSIS — Z3480 Encounter for supervision of other normal pregnancy, unspecified trimester: Secondary | ICD-10-CM | POA: Insufficient documentation

## 2023-03-26 DIAGNOSIS — O30043 Twin pregnancy, dichorionic/diamniotic, third trimester: Secondary | ICD-10-CM | POA: Diagnosis present

## 2023-03-26 DIAGNOSIS — Z148 Genetic carrier of other disease: Secondary | ICD-10-CM | POA: Diagnosis not present

## 2023-03-26 DIAGNOSIS — Z3A32 32 weeks gestation of pregnancy: Secondary | ICD-10-CM | POA: Diagnosis not present

## 2023-03-27 ENCOUNTER — Ambulatory Visit (INDEPENDENT_AMBULATORY_CARE_PROVIDER_SITE_OTHER): Payer: Medicaid Other

## 2023-03-27 VITALS — BP 121/77 | HR 110 | Wt 167.2 lb

## 2023-03-27 DIAGNOSIS — O30043 Twin pregnancy, dichorionic/diamniotic, third trimester: Secondary | ICD-10-CM

## 2023-03-27 DIAGNOSIS — Z3A32 32 weeks gestation of pregnancy: Secondary | ICD-10-CM

## 2023-03-27 DIAGNOSIS — Z3480 Encounter for supervision of other normal pregnancy, unspecified trimester: Secondary | ICD-10-CM

## 2023-03-27 NOTE — Progress Notes (Signed)
   PRENATAL VISIT NOTE  Subjective:  Sonya [name] is a 22 y.o. G1P0000 at [redacted]w[redacted]d being seen today for ongoing prenatal care.  She is currently monitored for the following issues for this high-risk pregnancy and has Cholelithiasis; MDD (major depressive disorder), recurrent episode, severe; Supervision of other normal pregnancy, antepartum; and Twin pregnancy, twins dichorionic and diamniotic on their problem list.  Patient reports no complaints.  Contractions: Not present. Vag. Bleeding: None.  Movement: Present. Denies leaking of fluid.   The following portions of the patient's history were reviewed and updated as appropriate: allergies, current medications, past family history, past medical history, past social history, past surgical history and problem list.   Objective:   Vitals:   03/27/23 0836  BP: 121/77  Pulse: (!) 110  Weight: 75.8 kg    Fetal Status: Fetal Heart Rate (bpm): A:145, B:144   Movement: Present     General:  Alert, oriented and cooperative. Patient is in no acute distress.  Skin: Skin is warm and dry. No rash noted.   Cardiovascular: Normal heart rate noted  Respiratory: Normal respiratory effort, no problems with respiration noted  Abdomen: Soft, gravid, appropriate for gestational age.  Pain/Pressure: Present     Pelvic: Not evaluated         Extremities: Normal range of motion.  Edema: None  Mental Status: Normal mood and affect. Normal behavior. Normal judgment and thought content.   Assessment and Plan:  Pregnancy: G1P0000 at [redacted]w[redacted]d 1. Supervision of other normal pregnancy, antepartum  2. Dichorionic diamniotic twin pregnancy in third trimester No reports of premature ctx  3. [redacted] weeks gestation of pregnancy Continue routine prenatal care    Preterm labor symptoms and general obstetric precautions including but not limited to vaginal bleeding, contractions, leaking of fluid and fetal movement were reviewed in detail with the patient. Please refer  to After Visit Summary for other counseling recommendations.   Return in about 2 weeks (around 04/10/2023) for HROB.  Future Appointments  Date Time Provider Department Center  04/02/2023 10:15 AM [facility] NURSE [facility]  04/02/2023 10:30 AM [facility] US2 [facility]  04/09/2023 10:15 AM [facility] NURSE [facility]  04/09/2023 10:30 AM [facility] US2 [facility]  04/10/2023  8:55 AM Warden Fillers, MD [facility] None  04/16/2023 10:15 AM [facility] NURSE [facility]  04/16/2023 10:30 AM [facility] US2 [facility]  04/23/2023 10:15 AM [facility] NURSE [facility]  04/23/2023 10:30 AM [facility] US2 [facility]  04/24/2023  9:35 AM Lennart Pall, MD [facility] None  04/30/2023 10:15 AM [facility] NURSE [facility]  04/30/2023 10:30 AM [facility] US2 [facility]  05/07/2023 10:15 AM [facility] NURSE [facility]  05/07/2023 10:30 AM [facility] US2 [facility]    Asencion Gowda, Student-PA

## 2023-03-27 NOTE — Patient Instructions (Signed)
We highly recommend childbirth education to help you plan for labor and begin practicing coping skills (which will be needed with or without pain meds).  [place] Childbirth Education Options: Sign up by visiting [email or web address]  Childbirth ~ Self-Paced eClass (English and Spanish) This online class offers you the freedom to complete a childbirth education series in the comfort of your own home at your own pace.  Childbirth Class (In-Person 4-Week Series  or on Saturdays, Virtual 4-Week Series ~ [place]) This interactive in-person class series will help you and your partner prepare for your birth experience. Topics include: Labor & Birth, Comfort Measures, Breathing Techniques, Massage, Medical Interventions, Pain Management Options, Cesarean Birth, Postpartum Care, and Newborn Care  Comfort Techniques for Labor ~ In-Person Class ([place]) This interactive class is designed for parents-to-be who want to learn & practice hands-on skills to help relieve some of the discomfort of labor and encourage their babies to rotate toward the best position for birth. Moms and their partners will be able to try a variety of labor positions with birth balls and rebozos as well as practice breathing, relaxation, and visualization techniques.  Natural Childbirth Class (In-Person 5-Week Series, In-Person on Saturdays or Virtual 5-Week Series ~ [place]) This class series is designed for expectant parents who want to learn and practice natural methods of coping with the process of labor and childbirth.  Cesarean Birth Self-Paced eClass (English and Spanish) This online course provides comprehensive information you can trust as you prepare for a possible cesarean birth. In this class, you'll learn how to make your birth and recovery comfortable and joyful through instructive video clips, animations, and activities.  Waterbirth ~ Virtual Class Interested in a waterbirth? In addition to a consultation  with your credentialed waterbirth provider, this free, informational online class will help you discover whether waterbirth is the right fit for you. Not all obstetrical practices offer waterbirth, so check with your healthcare provider.  Tour (Self-Paced Video) - [facility] [place] Watch our 4 minute video tour of [facility] located in [place].   [place] Parenting Education Options:  Pregnancy 101 (Virtual) Congratulations on your pregnancy! This class is geared toward moms in their first trimester, but everyone is welcome. We are excited to guide you through all aspects of supporting a healthy pregnancy. You will learn what to expect at routine prenatal care appointments, common postpartum adjustments, basic infant safety, and breastfeeding.  Successful Partnering & Parenting ~ In-Person Workshop ([place]) This workshop inspires and equips partners of all economic levels, ages, and cultures to confidently care for their infants, support the birthing persons, and navigate their own transformations into new partners and parents. Learning activities are geared towards supporting partner, but moms are welcome to attend.  'Baby & Me' Parenting Group (Virtual on Wednesdays at 11am) Enjoy this time discussing newborn & infant parenting topics and family adjustment issues with other new parents in a relaxed environment. Each week brings a new speaker or baby-centered activity. This group offers support and connection to parents as they journey through the adjustments and struggles of that sometimes overwhelming first year after the birth of a child.  Baby Safety, CPR, & Choking Class ~ Virtual This life-saving information is meant to encourage parents as they learn important safety and prevention tips as well as infant CPR and relief of choking.  Breastfeeding Class (In-Person in [place] or Virtual) Families learn what to expect in the  first days and weeks of breastfeeding your   newborn. IF YOU ARE AN EMPLOYEE TAKING THIS CLASS FOR CREDIT, DO NOT register yourself. Please e-mail [email or web address].   Breastfeeding Self-Paced eClass (English & Spanish) Families learn what to expect in the first days and weeks of breastfeeding your newborn.  Caring for Baby ~ In-Person, Virtual or Self-Paced Class This in-person class is for both expectant and adoptive parents who want to learn and practice the most up-to-date newborn care for their babies. Focus is on birth through the first six weeks of life.  CPR & Choking Relief for Infants & Children ~ In-Person Class ([place] [address]) This in-person course is designed for any parent, expectant parent, or adult who cares for infants or children. Participants learn and demonstrate cardiopulmonary resuscitation and choking relief procedures for both infants and children.  Grandparent Love ~ In-Person Class Grandparents will learn the most updated infant care and safety recommendations. They will discover ways to support their own children during the transition into the parenting role and receive tips on communicating with the new parents.  [place] Parenting Support Group Options:  Bereavement Grief Support Group (Pregnancy/Infant Loss) - Virtual This is an ongoing experience that meets once a month and is designed to help you honor the past, assist you in discovering tools to strengthen you today, and aid you in developing hope for the future.  Breastfeeding & Pumping Support Group (In-Person on Thursdays at 12pm or Virtual on Tuesdays at 5pm) Join us in-person each Thursday starting June 1st, 2023 at 12pm! This support group is free for all families looking for breastfeeding and/or pumping support.   Community-Based Childbirth Education Options:  [facility] Classes:  Childbirth education classes can help you get ready for a positive parenting experience. You  can also meet other expectant parents and get free stuff for your baby. Each class runs for five weeks on the same night and costs $45 for the mother-to-be and her support person. Medicaid covers the cost if you are eligible. Call [phone] to register.  [facility] [place] [employer] offers a variety of programs for the [place] community and is another great way to get connected. Please go to [email or web address] for more information.  Childbirth With A Twist! Be informed of your options, get educated on birth, understand what your body is doing, learn how to cope, and have a lot of fun and laughs all while doing it either from the comfort of your couch OR in our cozy office and classroom space near the [place] airport. If you are taking a virtual class, then class is taught LIVE, so you can ask questions and receive answers in real-time from an experienced doula and childbirth educator.  This virtual childbirth education class will meet for five instruction times online.  Although we are based in [place], [place], this virtual class is open to anyone in the world. Please visit: [email or web address] for more information.  Books We Love: The Doula Guide to Childbirth by Ananda Lowe and Rachel Zimmerman The First-Time Parent's Childbirth Handbook by Dr. Stephanie Mitchell, CNM The Birth Partner by Penny Simkin  

## 2023-04-02 ENCOUNTER — Ambulatory Visit: Payer: Medicaid Other | Attending: Obstetrics and Gynecology | Admitting: *Deleted

## 2023-04-02 ENCOUNTER — Ambulatory Visit: Payer: Medicaid Other | Attending: Maternal & Fetal Medicine

## 2023-04-02 ENCOUNTER — Ambulatory Visit: Payer: Medicaid Other | Admitting: *Deleted

## 2023-04-02 ENCOUNTER — Other Ambulatory Visit: Payer: Self-pay | Admitting: Maternal & Fetal Medicine

## 2023-04-02 ENCOUNTER — Ambulatory Visit: Payer: Medicaid Other

## 2023-04-02 VITALS — BP 132/73 | HR 85

## 2023-04-02 DIAGNOSIS — O285 Abnormal chromosomal and genetic finding on antenatal screening of mother: Secondary | ICD-10-CM | POA: Diagnosis not present

## 2023-04-02 DIAGNOSIS — Z3A33 33 weeks gestation of pregnancy: Secondary | ICD-10-CM | POA: Diagnosis not present

## 2023-04-02 DIAGNOSIS — Z3480 Encounter for supervision of other normal pregnancy, unspecified trimester: Secondary | ICD-10-CM

## 2023-04-02 DIAGNOSIS — O30043 Twin pregnancy, dichorionic/diamniotic, third trimester: Secondary | ICD-10-CM

## 2023-04-02 DIAGNOSIS — Z148 Genetic carrier of other disease: Secondary | ICD-10-CM | POA: Insufficient documentation

## 2023-04-02 NOTE — Procedures (Signed)
Fransisca Shawn [birth year] [redacted]w[redacted]d   Fetus B Non-Stress Test Interpretation for 04/02/23  Indication: Mercie Eon twins  Fetal Heart Rate Fetus B Mode: External Baseline Rate (B): 140 BPM Variability: Moderate Accelerations: 15 x 15 Decelerations: Variable  Uterine Activity Mode: Toco Contraction Frequency (min): 2-6 Contraction Duration (sec): 60-70 Contraction Quality: Mild (not felt by pt) Resting Tone Palpated: Relaxed Resting Time: Adequate  Interpretation (Baby B - Fetal Testing) Nonstress Test Interpretation (Baby B): Reactive Overall Impression (Baby B): Reassuring for gestational age Comments (Baby B): Tracing reviewed by Dr. Cherlynn June Diesel November 01, 2001 [redacted]w[redacted]d  Fetus A Non-Stress Test Interpretation for 04/02/23  Indication: Unsatisfactory BPP  Fetal Heart Rate A Mode: External Baseline Rate (A): 140 bpm Variability: Moderate Accelerations: 15 x 15 Decelerations: None Multiple birth?: Yes  Uterine Activity Mode: Toco Contraction Frequency (min): 2-6 Contraction Duration (sec): 60-70 Contraction Quality: Mild (not felt by pt) Resting Tone Palpated: Relaxed Resting Time: Adequate  Interpretation (Fetal Testing) Nonstress Test Interpretation: Reactive Overall Impression: Reassuring for gestational age Comments: Tracing reviewed by Dr. Grace Bushy

## 2023-04-03 ENCOUNTER — Encounter (HOSPITAL_COMMUNITY): Payer: Self-pay | Admitting: Obstetrics and Gynecology

## 2023-04-03 ENCOUNTER — Inpatient Hospital Stay (HOSPITAL_COMMUNITY)
Admission: AD | Admit: 2023-04-03 | Discharge: 2023-04-03 | Disposition: A | Discharge status: Home / Self Care | Payer: Medicaid Other | Attending: Obstetrics and Gynecology | Admitting: Obstetrics and Gynecology

## 2023-04-03 ENCOUNTER — Other Ambulatory Visit: Payer: Self-pay

## 2023-04-03 DIAGNOSIS — N949 Unspecified condition associated with female genital organs and menstrual cycle: Secondary | ICD-10-CM

## 2023-04-03 DIAGNOSIS — O26899 Other specified pregnancy related conditions, unspecified trimester: Secondary | ICD-10-CM

## 2023-04-03 DIAGNOSIS — O30043 Twin pregnancy, dichorionic/diamniotic, third trimester: Secondary | ICD-10-CM | POA: Diagnosis not present

## 2023-04-03 DIAGNOSIS — Z3480 Encounter for supervision of other normal pregnancy, unspecified trimester: Secondary | ICD-10-CM

## 2023-04-03 DIAGNOSIS — O99891 Other specified diseases and conditions complicating pregnancy: Secondary | ICD-10-CM | POA: Insufficient documentation

## 2023-04-03 DIAGNOSIS — R102 Pelvic and perineal pain: Secondary | ICD-10-CM | POA: Diagnosis present

## 2023-04-03 DIAGNOSIS — O26893 Other specified pregnancy related conditions, third trimester: Secondary | ICD-10-CM | POA: Insufficient documentation

## 2023-04-03 DIAGNOSIS — Z3A33 33 weeks gestation of pregnancy: Secondary | ICD-10-CM | POA: Diagnosis not present

## 2023-04-03 LAB — URINALYSIS, ROUTINE W REFLEX MICROSCOPIC
Bilirubin Urine: NEGATIVE
Glucose, UA: NEGATIVE mg/dL
Hgb urine dipstick: NEGATIVE
Ketones, ur: NEGATIVE mg/dL
Nitrite: NEGATIVE
Protein, ur: NEGATIVE mg/dL
Specific Gravity, Urine: 1.006 (ref 1.005–1.030)
pH: 6 (ref 5.0–8.0)

## 2023-04-03 MED ORDER — ACETAMINOPHEN 500 MG PO TABS
1000.0000 mg | ORAL_TABLET | ORAL | Status: AC
  Administered 2023-04-03: 1000 mg via ORAL
  Filled 2023-04-03: qty 2

## 2023-04-03 MED ORDER — CYCLOBENZAPRINE HCL 5 MG PO TABS
10.0000 mg | ORAL_TABLET | ORAL | Status: AC
  Administered 2023-04-03: 10 mg via ORAL
  Filled 2023-04-03: qty 2

## 2023-04-03 MED ORDER — CYCLOBENZAPRINE HCL 10 MG PO TABS: 10.0000 mg | ORAL_TABLET | Freq: Two times a day (BID) | ORAL | 0 refills | Status: DC | PRN

## 2023-04-03 NOTE — MAU Note (Signed)
Sonya [name] is a 22 y.o. at [redacted]w[redacted]d here in MAU reporting: pelvic pain and pressure for the last week. Reports increased pain and pressure since 7pm last night. Pressure is affecting gait, unable to walk straight, cannot stand up straight. Irregular ctx reported. Denies LOF or bleeding. Reports positive FM Di/Di- vertex/vertex twin boys as of U/S in office yesterday  Pain score: 8/10 in pelvis Vitals:   04/03/23 1345  BP: 113/69  Pulse: (!) 102  Resp: 17  Temp: 98 F (36.7 C)     FHT: A-135 B-138 Lab orders placed from triage: U/A

## 2023-04-03 NOTE — MAU Provider Note (Signed)
History     CSN: [id number]  Arrival date and time: 04/03/23 1256   Event Date/Time   First Provider Initiated Contact with Patient 04/03/23 1350      Chief Complaint  Patient presents with   Pelvic Pain   Sonya [name] , a  22 y.o. G1P0000 at [redacted]w[redacted]d presents to MAU with complaints of intense pelvic pressure and abdominal tightening. Patient states when she feels the tightening the pelvic pressure gets worse and its hard to move or walk. She states she attempted PO tylenol day before yesterday and states she didn't not get any relief. Currently rates pain a 0/10 but states the pain is 7 or 8 of 10 with movement. She states pain is worsened by movement especially when she has to get up. She denies abnormal vaginal discharge, vaginal bleeding or leaking of fluid. She states she is unsure if she is contracting or not. She denies wearing a maternity support belt or tape. She endorses positive fetal movement.          OB History     Gravida  1   Para  0   Term  0   Preterm  0   AB  0   Living  0      SAB  0   IAB  0   Ectopic  0   Multiple  0   Live Births  0           Past Medical History:  Diagnosis Date   ADHD (attention deficit hyperactivity disorder)    no meds currently   Anxiety    History of seasonal allergies     Past Surgical History:  Procedure Laterality Date   CHOLECYSTECTOMY N/A 01/29/2018   Procedure: LAPAROSCOPIC CHOLECYSTECTOMY WITH INTRAOPERATIVE CHOLANGIOGRAM;  Surgeon: Kandice Hams, MD;  Location: [facility] OR;  Service: Pediatrics;  Laterality: N/A;    Family History  Problem Relation Age of Onset   Epilepsy Mother    Schizophrenia Father    Heart disease Brother    Asthma Brother    Epilepsy Brother    Hypertension Maternal Grandfather    Diabetes Maternal Grandfather    Cancer Paternal Grandfather     Social History   Tobacco Use   Smoking status: Never   Smokeless tobacco: Never  Vaping Use   Vaping Use: Never used   Substance Use Topics   Alcohol use: Not Currently   Drug use: Not Currently    Types: Marijuana    Comment: last smoked 2 weeks ago (early October 2023)    Allergies: No Known Allergies  Medications Prior to Admission  Medication Sig Dispense Refill Last Dose   aspirin EC 81 MG tablet Take 1 tablet (81 mg total) by mouth daily. Take after 12 weeks for prevention of preeclampsia later in pregnancy 300 tablet 2 Past Week   Prenatal 28-0.8 MG TABS Take 1 tablet by mouth daily. 30 tablet 12 04/02/2023 at 2100   Blood Pressure Monitoring (BLOOD PRESSURE KIT) DEVI 1 Device by Does not apply route once a week. 1 each 0     Review of Systems  Constitutional:  Negative for chills, fatigue and fever.  Eyes:  Negative for pain and visual disturbance.  Respiratory:  Negative for apnea, shortness of breath and wheezing.   Cardiovascular:  Negative for chest pain and palpitations.  Gastrointestinal:  Negative for abdominal pain, constipation, diarrhea, nausea and vomiting.  Genitourinary:  Positive for pelvic pain. Negative for difficulty urinating, dysuria, vaginal  bleeding, vaginal discharge and vaginal pain.  Musculoskeletal:  Negative for back pain.  Neurological:  Negative for seizures, weakness and headaches.  Psychiatric/Behavioral:  Negative for suicidal ideas.    Physical Exam   Blood pressure 113/69, pulse (!) 102, temperature 98 F (36.7 C), temperature source Oral, resp. rate 17, height  (1.676 m), weight 79.4 kg, last menstrual period 08/14/2022.  Physical Exam Vitals and nursing note reviewed.  Constitutional:      General: She is not in acute distress.    Appearance: Normal appearance.  HENT:     Head: Normocephalic.  Cardiovascular:     Rate and Rhythm: Normal rate.  Pulmonary:     Effort: Pulmonary effort is normal.  Abdominal:     Palpations: Abdomen is soft.     Tenderness: There is no abdominal tenderness. There is no guarding.     Comments: Pregnant    Musculoskeletal:        General: Normal range of motion.     Cervical back: Normal range of motion.  Skin:    General: Skin is warm and dry.  Neurological:     Mental Status: She is alert and oriented to person, place, and time.  Psychiatric:        Mood and Affect: Mood normal.   FHT A: 135bpm with moderate variability accels present no decels.  (Appropriate for gestational age.)   FHT B: 145 bpm with moderate variability accels present no decels. (Appropriate for gestational age.)   Toco: Occasional contractions with UI. (Patient unaware of contractions)  MAU Course  Procedures Orders Placed This Encounter  Procedures   Urinalysis, Routine w reflex microscopic -Urine, Clean Catch   Meds ordered this encounter  Medications   cyclobenzaprine (FLEXERIL) tablet 10 mg   acetaminophen (TYLENOL) tablet 1,000 mg    MDM - Dilation: Fingertip Effacement (%): Thick Exam by:: Rhunette Croft, CNM - Low suspicion for Preterm labor  - Patient reports pain improved with PO Tylenol and Flexeril.  - Plan for discharge.   Assessment and Plan   1. Round ligament pain   2. Supervision of other normal pregnancy, antepartum   3. Pelvic pressure in pregnancy   4. [redacted] weeks gestation of pregnancy   5. Dichorionic diamniotic twin pregnancy in third trimester    - Reviewed round ligament pain and pelvic pressure as normal discomfort of pregnancy. Discussed at home comfort measures and stretches than can be performed to help alleviate discomfort.  - Also discussed OTC options like Tylenol and Epson salt baths.  - Rx for PO Flexeril sent to outpatient pharmacy.  - Worsening signs and return precautions reviewed.  - FHT A & B  appropriate for gestational age upon time of discharge.  - Patient discharged home in stable condition and may return to MAU as needed.   Claudette Head, MSN CNM  04/03/2023, 1:51 PM

## 2023-04-03 NOTE — Discharge Instructions (Signed)

## 2023-04-09 ENCOUNTER — Ambulatory Visit: Payer: Medicaid Other

## 2023-04-09 ENCOUNTER — Ambulatory Visit: Payer: Medicaid Other | Attending: Maternal & Fetal Medicine

## 2023-04-09 VITALS — BP 127/77 | HR 117

## 2023-04-09 DIAGNOSIS — O30043 Twin pregnancy, dichorionic/diamniotic, third trimester: Secondary | ICD-10-CM

## 2023-04-09 DIAGNOSIS — Z3480 Encounter for supervision of other normal pregnancy, unspecified trimester: Secondary | ICD-10-CM

## 2023-04-09 DIAGNOSIS — O285 Abnormal chromosomal and genetic finding on antenatal screening of mother: Secondary | ICD-10-CM

## 2023-04-09 DIAGNOSIS — Z3A34 34 weeks gestation of pregnancy: Secondary | ICD-10-CM

## 2023-04-09 DIAGNOSIS — Z148 Genetic carrier of other disease: Secondary | ICD-10-CM

## 2023-04-10 ENCOUNTER — Ambulatory Visit (INDEPENDENT_AMBULATORY_CARE_PROVIDER_SITE_OTHER): Payer: Medicaid Other | Admitting: Obstetrics and Gynecology

## 2023-04-10 VITALS — BP 116/81 | HR 92 | Wt 179.0 lb

## 2023-04-10 DIAGNOSIS — O30043 Twin pregnancy, dichorionic/diamniotic, third trimester: Secondary | ICD-10-CM

## 2023-04-10 DIAGNOSIS — Z3480 Encounter for supervision of other normal pregnancy, unspecified trimester: Secondary | ICD-10-CM

## 2023-04-10 DIAGNOSIS — Z3A34 34 weeks gestation of pregnancy: Secondary | ICD-10-CM

## 2023-04-10 NOTE — Progress Notes (Signed)
   PRENATAL VISIT NOTE  Subjective:  Sonya [name] is a 22 y.o. G1P0000 at [redacted]w[redacted]d being seen today for ongoing prenatal care.  She is currently monitored for the following issues for this high-risk pregnancy and has Cholelithiasis; MDD (major depressive disorder), recurrent episode, severe; Supervision of other normal pregnancy, antepartum; and Twin pregnancy, twins dichorionic and diamniotic on their problem list.  Patient doing well with no acute concerns today. She reports occasional contractions.  Contractions: Not present. Vag. Bleeding: None.  Movement: Present. Denies leaking of fluid.   The following portions of the patient's history were reviewed and updated as appropriate: allergies, current medications, past family history, past medical history, past social history, past surgical history and problem list. Problem list updated.  Objective:   Vitals:   04/10/23 0854  BP: 116/81  Pulse: 92  Weight: 179 lb (81.2 kg)    Fetal Status: Fetal Heart Rate (bpm): A-145,B-150 Fundal Height: 39 cm Movement: Present     General:  Alert, oriented and cooperative. Patient is in no acute distress.  Skin: Skin is warm and dry. No rash noted.   Cardiovascular: Normal heart rate noted  Respiratory: Normal respiratory effort, no problems with respiration noted  Abdomen: Soft, gravid, appropriate for gestational age.  Pain/Pressure: Present     Pelvic: Cervical exam deferred        Extremities: Normal range of motion.     Mental Status:  Normal mood and affect. Normal behavior. Normal judgment and thought content.   Assessment and Plan:  Pregnancy: G1P0000 at [redacted]w[redacted]d  1. [redacted] weeks gestation of pregnancy   2. Dichorionic diamniotic twin pregnancy in third trimester Pt seen, last BPP 8/8, vtx transverse position documented.  Discussed delivery options  ie potential for delivery of vaginal breech delivery of Twin B.  Of note, twin B is slightly bigger than twin A.  Discussed outcomes of  successful breech delivery, vaginal delivery of twin A followed by emergent cesarean delivery of twin B or scheduled cesarean section of both twins.  Per pt MFM suggested delivery between 37-38 weeks which fits with their guidelines  3. Supervision of other normal pregnancy, antepartum Continue routine prenatal care  Preterm labor symptoms and general obstetric precautions including but not limited to vaginal bleeding, contractions, leaking of fluid and fetal movement were reviewed in detail with the patient.  Please refer to After Visit Summary for other counseling recommendations.   Return in about 2 weeks (around 04/24/2023) for [facility], in person, 36 weeks swabs.   Mariel Aloe, MD Faculty Attending Center for [facility]

## 2023-04-16 ENCOUNTER — Ambulatory Visit: Payer: Medicaid Other | Admitting: *Deleted

## 2023-04-16 ENCOUNTER — Ambulatory Visit: Payer: Medicaid Other | Attending: Maternal & Fetal Medicine

## 2023-04-16 VITALS — BP 119/74 | HR 84

## 2023-04-16 DIAGNOSIS — O30043 Twin pregnancy, dichorionic/diamniotic, third trimester: Secondary | ICD-10-CM | POA: Diagnosis present

## 2023-04-16 DIAGNOSIS — Z3A35 35 weeks gestation of pregnancy: Secondary | ICD-10-CM | POA: Insufficient documentation

## 2023-04-16 DIAGNOSIS — Z148 Genetic carrier of other disease: Secondary | ICD-10-CM | POA: Diagnosis not present

## 2023-04-16 DIAGNOSIS — O285 Abnormal chromosomal and genetic finding on antenatal screening of mother: Secondary | ICD-10-CM

## 2023-04-16 DIAGNOSIS — Z3480 Encounter for supervision of other normal pregnancy, unspecified trimester: Secondary | ICD-10-CM | POA: Insufficient documentation

## 2023-04-19 ENCOUNTER — Other Ambulatory Visit: Payer: Self-pay | Admitting: Advanced Practice Midwife

## 2023-04-19 ENCOUNTER — Inpatient Hospital Stay (EMERGENCY_DEPARTMENT_HOSPITAL)
Admission: AD | Admit: 2023-04-19 | Discharge: 2023-04-19 | Disposition: A | Discharge status: Home / Self Care | Payer: Medicaid Other | Source: Home / Self Care | Attending: Obstetrics & Gynecology | Admitting: Obstetrics & Gynecology

## 2023-04-19 ENCOUNTER — Inpatient Hospital Stay (HOSPITAL_BASED_OUTPATIENT_CLINIC_OR_DEPARTMENT_OTHER): Payer: Medicaid Other

## 2023-04-19 ENCOUNTER — Telehealth: Payer: Self-pay

## 2023-04-19 ENCOUNTER — Encounter (HOSPITAL_COMMUNITY): Payer: Self-pay | Admitting: Obstetrics & Gynecology

## 2023-04-19 DIAGNOSIS — D508 Other iron deficiency anemias: Secondary | ICD-10-CM

## 2023-04-19 DIAGNOSIS — Z148 Genetic carrier of other disease: Secondary | ICD-10-CM | POA: Diagnosis not present

## 2023-04-19 DIAGNOSIS — O30043 Twin pregnancy, dichorionic/diamniotic, third trimester: Secondary | ICD-10-CM | POA: Insufficient documentation

## 2023-04-19 DIAGNOSIS — Z3A35 35 weeks gestation of pregnancy: Secondary | ICD-10-CM | POA: Insufficient documentation

## 2023-04-19 DIAGNOSIS — O368130 Decreased fetal movements, third trimester, not applicable or unspecified: Secondary | ICD-10-CM

## 2023-04-19 DIAGNOSIS — O1403 Mild to moderate pre-eclampsia, third trimester: Secondary | ICD-10-CM | POA: Insufficient documentation

## 2023-04-19 DIAGNOSIS — Z3480 Encounter for supervision of other normal pregnancy, unspecified trimester: Secondary | ICD-10-CM

## 2023-04-19 DIAGNOSIS — O285 Abnormal chromosomal and genetic finding on antenatal screening of mother: Secondary | ICD-10-CM

## 2023-04-19 DIAGNOSIS — O99891 Other specified diseases and conditions complicating pregnancy: Secondary | ICD-10-CM | POA: Insufficient documentation

## 2023-04-19 DIAGNOSIS — O99323 Drug use complicating pregnancy, third trimester: Secondary | ICD-10-CM | POA: Insufficient documentation

## 2023-04-19 DIAGNOSIS — O1493 Unspecified pre-eclampsia, third trimester: Secondary | ICD-10-CM

## 2023-04-19 HISTORY — DX: Depression, unspecified: F32.A

## 2023-04-19 LAB — CBC WITH DIFFERENTIAL/PLATELET
Abs Immature Granulocytes: 0.08 10*3/uL — ABNORMAL HIGH (ref 0.00–0.07)
Basophils Absolute: 0 10*3/uL (ref 0.0–0.1)
Basophils Relative: 0 %
Eosinophils Absolute: 0.1 10*3/uL (ref 0.0–0.5)
Eosinophils Relative: 1 %
HCT: 25.7 % — ABNORMAL LOW (ref 36.0–46.0)
Hemoglobin: 7.7 g/dL — ABNORMAL LOW (ref 12.0–15.0)
Immature Granulocytes: 2 %
Lymphocytes Relative: 18 %
Lymphs Abs: 1 10*3/uL (ref 0.7–4.0)
MCH: 23.3 pg — ABNORMAL LOW (ref 26.0–34.0)
MCHC: 30 g/dL (ref 30.0–36.0)
MCV: 77.6 fL — ABNORMAL LOW (ref 80.0–100.0)
Monocytes Absolute: 0.6 10*3/uL (ref 0.1–1.0)
Monocytes Relative: 11 %
Neutro Abs: 3.7 10*3/uL (ref 1.7–7.7)
Neutrophils Relative %: 68 %
Platelets: 233 10*3/uL (ref 150–400)
RBC: 3.31 MIL/uL — ABNORMAL LOW (ref 3.87–5.11)
RDW: 15.6 % — ABNORMAL HIGH (ref 11.5–15.5)
WBC: 5.4 10*3/uL (ref 4.0–10.5)
nRBC: 0.9 % — ABNORMAL HIGH (ref 0.0–0.2)

## 2023-04-19 LAB — COMPREHENSIVE METABOLIC PANEL
ALT: 9 U/L (ref 0–44)
AST: 19 U/L (ref 15–41)
Albumin: 2.2 g/dL — ABNORMAL LOW (ref 3.5–5.0)
Alkaline Phosphatase: 179 U/L — ABNORMAL HIGH (ref 38–126)
Anion gap: 10 (ref 5–15)
BUN: <5 mg/dL — ABNORMAL LOW (ref 6–20)
CO2: 19 mmol/L — ABNORMAL LOW (ref 22–32)
Calcium: 8.1 mg/dL — ABNORMAL LOW (ref 8.9–10.3)
Chloride: 107 mmol/L (ref 98–111)
Creatinine, Ser: 0.85 mg/dL (ref 0.44–1.00)
GFR, Estimated: >60 mL/min (ref >60)
Glucose, Bld: 86 mg/dL (ref 70–99)
Potassium: 4 mmol/L (ref 3.5–5.1)
Sodium: 136 mmol/L (ref 135–145)
Total Bilirubin: 0.4 mg/dL (ref 0.3–1.2)
Total Protein: 5.2 g/dL — ABNORMAL LOW (ref 6.5–8.1)

## 2023-04-19 LAB — PROTEIN / CREATININE RATIO, URINE
Creatinine, Urine: 123 mg/dL
Protein Creatinine Ratio: 0.86 mg/mg{Cre} — ABNORMAL HIGH (ref 0.00–0.15)
Total Protein, Urine: 106 mg/dL

## 2023-04-19 NOTE — Telephone Encounter (Signed)
Patient called stating that she has significant swelling all the up to her knees. States that she has pain in her legs from the swelling. Sees spots  and floaters with nausea episodes. Denies headaches. Unable to check BP at home because she lost her cuff after moving. Patient advised to go to MAU for evaluation.

## 2023-04-19 NOTE — MAU Provider Note (Signed)
History     CSN: [id number]  Arrival date and time: 04/19/23 1210   Event Date/Time   First Provider Initiated Contact with Patient 04/19/23 1314      Chief Complaint  Patient presents with   Leg Swelling   Foot Swelling   Nausea   Sonya [name] is a 22 y.o. G1P0000 at [redacted]w[redacted]d who presents today due to swelling. She reports that this morning she noticed that her legs were very swollen up to her knees and it was uncomfortable. She was worried that this was due to high blood pressure, but she did not have a way to check her blood pressure. She called the office and was advised to come here for eval. She denies any complications with this pregnancy at this time. Other than twin gestation everything has been normal. She denies any HA, visual disturbance or RUQ pain. She denies any contractions, VB or LOF. She reports normal fetal movements x 2.     OB History     Gravida  1   Para  0   Term  0   Preterm  0   AB  0   Living  0      SAB  0   IAB  0   Ectopic  0   Multiple  0   Live Births  0           Past Medical History:  Diagnosis Date   ADHD (attention deficit hyperactivity disorder)    no meds currently   Anxiety    Depression    History of seasonal allergies     Past Surgical History:  Procedure Laterality Date   CHOLECYSTECTOMY N/A 01/29/2018   Procedure: LAPAROSCOPIC CHOLECYSTECTOMY WITH INTRAOPERATIVE CHOLANGIOGRAM;  Surgeon: Kandice Hams, MD;  Location: [facility] OR;  Service: Pediatrics;  Laterality: N/A;    Family History  Problem Relation Age of Onset   Epilepsy Mother    Schizophrenia Father    Heart disease Brother    Asthma Brother    Epilepsy Brother    Hypertension Maternal Grandfather    Diabetes Maternal Grandfather    Cancer Paternal Grandfather     Social History   Tobacco Use   Smoking status: Never   Smokeless tobacco: Never  Vaping Use   Vaping Use: Never used  Substance Use Topics   Alcohol use: Not Currently    Drug use: Not Currently    Types: Marijuana    Comment: last smoked 2 weeks ago (early October 2023)    Allergies: No Known Allergies  Medications Prior to Admission  Medication Sig Dispense Refill Last Dose   aspirin EC 81 MG tablet Take 1 tablet (81 mg total) by mouth daily. Take after 12 weeks for prevention of preeclampsia later in pregnancy 300 tablet 2 04/19/2023   cyclobenzaprine (FLEXERIL) 10 MG tablet Take 1 tablet (10 mg total) by mouth 2 (two) times daily as needed for muscle spasms. 20 tablet 0 Past Month   Prenatal 28-0.8 MG TABS Take 1 tablet by mouth daily. 30 tablet 12 04/19/2023   Blood Pressure Monitoring (BLOOD PRESSURE KIT) DEVI 1 Device by Does not apply route once a week. 1 each 0     Review of Systems  All other systems reviewed and are negative.  Physical Exam   Blood pressure (!) 144/100, pulse 86, temperature 99 F (37.2 C), temperature source Oral, resp. rate 18, last menstrual period 08/14/2022, SpO2 99 %.  Physical Exam Vitals and nursing note reviewed.  Exam conducted with a chaperone present.  Constitutional:      General: She is not in acute distress. HENT:     Head: Normocephalic.  Eyes:     Pupils: Pupils are equal, round, and reactive to light.  Cardiovascular:     Rate and Rhythm: Normal rate.  Pulmonary:     Effort: Pulmonary effort is normal.  Abdominal:     Palpations: Abdomen is soft.     Tenderness: There is no abdominal tenderness.  Musculoskeletal:     Right lower leg: Edema present.     Left lower leg: Edema present.  Skin:    General: Skin is warm and dry.  Neurological:     Mental Status: She is alert and oriented to person, place, and time.  Psychiatric:        Mood and Affect: Mood normal.        Behavior: Behavior normal.    NST Baby A Baseline: 125 Variability: moderate Accels: 15x15 Decels: none Toco: none Reactive/Appropriate for GA   NST Baby B Baseline: 130 Variability: moderate Accels: 15x15 Decels:  none Toco: none Reactive/Appropriate for GA   Patient Vitals for the past 24 hrs:  BP Temp Temp src Pulse Resp SpO2  04/19/23 1605 (!) 144/100 -- -- 86 -- --  04/19/23 1431 (!) 151/93 -- -- 62 -- --  04/19/23 1415 (!) 140/101 -- -- 80 -- 99 %  04/19/23 1401 (!) 142/90 -- -- 70 -- --  04/19/23 1345 (!) 134/92 -- -- 89 -- 100 %  04/19/23 1331 133/88 -- -- 79 -- --  04/19/23 1316 136/85 -- -- 67 -- --  04/19/23 1301 (!) 151/94 -- -- 75 -- --  04/19/23 1247 139/87 -- -- 82 -- --  04/19/23 1241 136/88 99 F (37.2 C) Oral 75 18 --  04/19/23 1240 -- -- -- -- -- 100 %   Results for orders placed or performed during the hospital encounter of 04/19/23 (from the past 24 hour(s))  Protein / creatinine ratio, urine     Status: Abnormal   Collection Time: 04/19/23 12:54 PM  Result Value Ref Range   Creatinine, Urine 123 mg/dL   Total Protein, Urine 106 mg/dL   Protein Creatinine Ratio 0.86 (H) 0.00 - 0.15 mg/mg[Cre]  CBC with Differential/Platelet     Status: Abnormal   Collection Time: 04/19/23  1:38 PM  Result Value Ref Range   WBC 5.4 4.0 - 10.5 K/uL   RBC 3.31 (L) 3.87 - 5.11 MIL/uL   Hemoglobin 7.7 (L) 12.0 - 15.0 g/dL   HCT 16.1 (L) 09.6 - 04.5 %   MCV 77.6 (L) 80.0 - 100.0 fL   MCH 23.3 (L) 26.0 - 34.0 pg   MCHC 30.0 30.0 - 36.0 g/dL   RDW 40.9 (H) 81.1 - 91.4 %   Platelets 233 150 - 400 K/uL   nRBC 0.9 (H) 0.0 - 0.2 %   Neutrophils Relative % 68 %   Neutro Abs 3.7 1.7 - 7.7 K/uL   Lymphocytes Relative 18 %   Lymphs Abs 1.0 0.7 - 4.0 K/uL   Monocytes Relative 11 %   Monocytes Absolute 0.6 0.1 - 1.0 K/uL   Eosinophils Relative 1 %   Eosinophils Absolute 0.1 0.0 - 0.5 K/uL   Basophils Relative 0 %   Basophils Absolute 0.0 0.0 - 0.1 K/uL   Immature Granulocytes 2 %   Abs Immature Granulocytes 0.08 (H) 0.00 - 0.07 K/uL  Comprehensive metabolic panel  Status: Abnormal   Collection Time: 04/19/23  1:38 PM  Result Value Ref Range   Sodium 136 135 - 145 mmol/L   Potassium  4.0 3.5 - 5.1 mmol/L   Chloride 107 98 - 111 mmol/L   CO2 19 (L) 22 - 32 mmol/L   Glucose, Bld 86 70 - 99 mg/dL   BUN <5 (L) 6 - 20 mg/dL   Creatinine, Ser 2.13 0.44 - 1.00 mg/dL   Calcium 8.1 (L) 8.9 - 10.3 mg/dL   Total Protein 5.2 (L) 6.5 - 8.1 g/dL   Albumin 2.2 (L) 3.5 - 5.0 g/dL   AST 19 15 - 41 U/L   ALT 9 0 - 44 U/L   Alkaline Phosphatase 179 (H) 38 - 126 U/L   Total Bilirubin 0.4 0.3 - 1.2 mg/dL   GFR, Estimated >08 >65 mL/min   Anion gap 10 5 - 15     MAU Course  Procedures  MDM [place] BPP Baby A: 8/8, Vertex Baby B: 8/8, Vertex  1559: DW Dr. Macon Large, she will discuss delivery planning with MFM. Also patient can be offered blood today or to get a transfusion when she comes in for admission for induction.  MFM recommends 37 weeks delivery    [facility] scheduled [facility]20[facility] Next MFM visit is 04/23/2023 Next OB visit is 04/24/2023  DW patient and offered blood today v waiting for IOL date for blood. She would prefer to have this done when she comes in for IOL.   Will collect GBS and GC/CT today in case she needs to be delivered sooner.   Assessment and Plan   1. Supervision of other normal pregnancy, antepartum   2. Mild pre-eclampsia in third trimester   3. Dichorionic diamniotic twin pregnancy in third trimester   4. [redacted] weeks gestation of pregnancy    DC home in stable condition  3rd Trimester precautions  Pre-eclampsia danger signs   PTL precautions  Fetal kick counts RX: none  Return to MAU as needed FU with OB as planned   Follow-up Information     [facility] Follow up.   Why: As scheduled Contact information: [address] [place] [address] [phone]        [facility] Follow up.   Specialty: Maternal and Fetal Medicine Why: As scheduled Contact information: [address], [address] [place] [address] [phone]                Thressa Sheller DNP, CNM  04/19/23  4:34 PM

## 2023-04-19 NOTE — MAU Note (Signed)
Sonya [name] is a 22 y.o. at [redacted]w[redacted]d here in MAU reporting: increase in swelling from knees down, hurts to walk.  First noted on Monday. Reports nausea, when nauseated= her vision blurs.   Denies HA or RUQ pain.  Reports +FM. Has been having some mild contractions in lower abd, has not timed them. No bleeding or leaking. Has not checked BP Onset of complaint: Mond Pain score: mild There were no vitals filed for this visit.   FHT:by pass triage, reports +FM Lab orders placed from triage:  UA obtained

## 2023-04-21 ENCOUNTER — Inpatient Hospital Stay (HOSPITAL_COMMUNITY)
Admission: AD | Admit: 2023-04-21 | Discharge: 2023-04-23 | DRG: 807 | Disposition: A | Discharge status: Home / Self Care | Payer: Medicaid Other | Attending: Obstetrics and Gynecology | Admitting: Obstetrics and Gynecology

## 2023-04-21 ENCOUNTER — Encounter (HOSPITAL_COMMUNITY): Payer: Self-pay | Admitting: Obstetrics and Gynecology

## 2023-04-21 DIAGNOSIS — O42913 Preterm premature rupture of membranes, unspecified as to length of time between rupture and onset of labor, third trimester: Secondary | ICD-10-CM | POA: Diagnosis present

## 2023-04-21 DIAGNOSIS — O42013 Preterm premature rupture of membranes, onset of labor within 24 hours of rupture, third trimester: Secondary | ICD-10-CM | POA: Diagnosis not present

## 2023-04-21 DIAGNOSIS — Z3A35 35 weeks gestation of pregnancy: Secondary | ICD-10-CM

## 2023-04-21 DIAGNOSIS — O1400 Mild to moderate pre-eclampsia, unspecified trimester: Secondary | ICD-10-CM | POA: Diagnosis present

## 2023-04-21 DIAGNOSIS — O323XX1 Maternal care for face, brow and chin presentation, fetus 1: Secondary | ICD-10-CM | POA: Diagnosis present

## 2023-04-21 DIAGNOSIS — O9902 Anemia complicating childbirth: Secondary | ICD-10-CM | POA: Diagnosis present

## 2023-04-21 DIAGNOSIS — O1404 Mild to moderate pre-eclampsia, complicating childbirth: Secondary | ICD-10-CM | POA: Diagnosis present

## 2023-04-21 DIAGNOSIS — O30049 Twin pregnancy, dichorionic/diamniotic, unspecified trimester: Secondary | ICD-10-CM | POA: Diagnosis present

## 2023-04-21 DIAGNOSIS — O30043 Twin pregnancy, dichorionic/diamniotic, third trimester: Secondary | ICD-10-CM | POA: Diagnosis present

## 2023-04-21 DIAGNOSIS — Z3480 Encounter for supervision of other normal pregnancy, unspecified trimester: Principal | ICD-10-CM

## 2023-04-21 DIAGNOSIS — D508 Other iron deficiency anemias: Secondary | ICD-10-CM

## 2023-04-21 DIAGNOSIS — D649 Anemia, unspecified: Secondary | ICD-10-CM | POA: Diagnosis present

## 2023-04-21 DIAGNOSIS — O322XX2 Maternal care for transverse and oblique lie, fetus 2: Secondary | ICD-10-CM | POA: Diagnosis present

## 2023-04-21 DIAGNOSIS — O99344 Other mental disorders complicating childbirth: Secondary | ICD-10-CM | POA: Diagnosis not present

## 2023-04-21 DIAGNOSIS — O1493 Unspecified pre-eclampsia, third trimester: Secondary | ICD-10-CM

## 2023-04-21 HISTORY — DX: Gestational (pregnancy-induced) hypertension without significant proteinuria, unspecified trimester: O13.9

## 2023-04-21 LAB — PROTEIN / CREATININE RATIO, URINE
Creatinine, Urine: 166 mg/dL
Protein Creatinine Ratio: 0.56 mg/mg{Cre} — ABNORMAL HIGH (ref 0.00–0.15)
Total Protein, Urine: 93 mg/dL

## 2023-04-21 LAB — CBC
HCT: 31.4 % — ABNORMAL LOW (ref 36.0–46.0)
HCT: 28.4 % — ABNORMAL LOW (ref 36.0–46.0)
Hemoglobin: 9.2 g/dL — ABNORMAL LOW (ref 12.0–15.0)
Hemoglobin: 8.6 g/dL — ABNORMAL LOW (ref 12.0–15.0)
MCH: 23 pg — ABNORMAL LOW (ref 26.0–34.0)
MCH: 23.2 pg — ABNORMAL LOW (ref 26.0–34.0)
MCHC: 29.3 g/dL — ABNORMAL LOW (ref 30.0–36.0)
MCHC: 30.3 g/dL (ref 30.0–36.0)
MCV: 78.5 fL — ABNORMAL LOW (ref 80.0–100.0)
MCV: 76.8 fL — ABNORMAL LOW (ref 80.0–100.0)
Platelets: 286 10*3/uL (ref 150–400)
Platelets: 257 10*3/uL (ref 150–400)
RBC: 4 MIL/uL (ref 3.87–5.11)
RBC: 3.7 MIL/uL — ABNORMAL LOW (ref 3.87–5.11)
RDW: 15.8 % — ABNORMAL HIGH (ref 11.5–15.5)
RDW: 15.9 % — ABNORMAL HIGH (ref 11.5–15.5)
WBC: 7.4 10*3/uL (ref 4.0–10.5)
WBC: 7.3 10*3/uL (ref 4.0–10.5)
nRBC: 0 % (ref 0.0–0.2)
nRBC: 0.3 % — ABNORMAL HIGH (ref 0.0–0.2)

## 2023-04-21 LAB — TYPE AND SCREEN
ABO/RH(D): O POS
Antibody Screen: NEGATIVE

## 2023-04-21 LAB — COMPREHENSIVE METABOLIC PANEL
ALT: 11 U/L (ref 0–44)
AST: 33 U/L (ref 15–41)
Albumin: 2.6 g/dL — ABNORMAL LOW (ref 3.5–5.0)
Alkaline Phosphatase: 223 U/L — ABNORMAL HIGH (ref 38–126)
Anion gap: 13 (ref 5–15)
BUN: 8 mg/dL (ref 6–20)
CO2: 17 mmol/L — ABNORMAL LOW (ref 22–32)
Calcium: 8.5 mg/dL — ABNORMAL LOW (ref 8.9–10.3)
Chloride: 104 mmol/L (ref 98–111)
Creatinine, Ser: 1.07 mg/dL — ABNORMAL HIGH (ref 0.44–1.00)
GFR, Estimated: >60 mL/min (ref >60)
Glucose, Bld: 63 mg/dL — ABNORMAL LOW (ref 70–99)
Potassium: 3.9 mmol/L (ref 3.5–5.1)
Sodium: 134 mmol/L — ABNORMAL LOW (ref 135–145)
Total Bilirubin: 0.7 mg/dL (ref 0.3–1.2)
Total Protein: 6 g/dL — ABNORMAL LOW (ref 6.5–8.1)

## 2023-04-21 LAB — CULTURE, BETA STREP (GROUP B ONLY)

## 2023-04-21 LAB — POCT FERN TEST: POCT Fern Test: POSITIVE

## 2023-04-21 MED ORDER — ONDANSETRON HCL 4 MG/2ML IJ SOLN: 4.0000 mg | Freq: Four times a day (QID) | INTRAMUSCULAR | Status: DC | PRN

## 2023-04-21 MED ORDER — LIDOCAINE HCL (PF) 1 % IJ SOLN: 30.0000 mL | INTRAMUSCULAR | Status: DC | PRN

## 2023-04-21 MED ORDER — LACTATED RINGERS IV SOLN
500.0000 mL | Freq: Once | INTRAVENOUS | Status: AC
  Administered 2023-04-22: 500 mL via INTRAVENOUS

## 2023-04-21 MED ORDER — ACETAMINOPHEN 325 MG PO TABS: 650.0000 mg | ORAL_TABLET | ORAL | Status: DC | PRN

## 2023-04-21 MED ORDER — EPHEDRINE 5 MG/ML INJ: 10.0000 mg | INTRAVENOUS | Status: DC | PRN

## 2023-04-21 MED ORDER — LACTATED RINGERS IV SOLN
INTRAVENOUS | Status: DC
Start: 2023-04-21 — End: 2023-04-21

## 2023-04-21 MED ORDER — OXYTOCIN BOLUS FROM INFUSION
333.0000 mL | Freq: Once | INTRAVENOUS | Status: AC
  Administered 2023-04-22: 333 mL via INTRAVENOUS

## 2023-04-21 MED ORDER — PHENYLEPHRINE 80 MCG/ML (10ML) SYRINGE FOR IV PUSH (FOR BLOOD PRESSURE SUPPORT): 80.0000 ug | PREFILLED_SYRINGE | INTRAVENOUS | Status: DC | PRN

## 2023-04-21 MED ORDER — SODIUM CHLORIDE 0.9 % IV SOLN
5.0000 10*6.[IU] | Freq: Once | INTRAVENOUS | Status: AC
  Administered 2023-04-21: 5 10*6.[IU] via INTRAVENOUS
  Filled 2023-04-21: qty 5

## 2023-04-21 MED ORDER — LACTATED RINGERS IV SOLN: 500.0000 mL | INTRAVENOUS | Status: DC | PRN

## 2023-04-21 MED ORDER — FENTANYL-BUPIVACAINE-NACL 0.5-0.125-0.9 MG/250ML-% EP SOLN
12.0000 mL/h | EPIDURAL | Status: DC | PRN
  Filled 2023-04-21: qty 250

## 2023-04-21 MED ORDER — OXYCODONE-ACETAMINOPHEN 5-325 MG PO TABS: 1.0000 | ORAL_TABLET | ORAL | Status: DC | PRN

## 2023-04-21 MED ORDER — SOD CITRATE-CITRIC ACID 500-334 MG/5ML PO SOLN
30.0000 mL | ORAL | Status: DC | PRN
Start: 2023-04-21 — End: 2023-04-21

## 2023-04-21 MED ORDER — OXYTOCIN BOLUS FROM INFUSION
333.0000 mL | Freq: Once | INTRAVENOUS | Status: DC
Start: 2023-04-21 — End: 2023-04-21

## 2023-04-21 MED ORDER — PHENYLEPHRINE 80 MCG/ML (10ML) SYRINGE FOR IV PUSH (FOR BLOOD PRESSURE SUPPORT)
80.0000 ug | PREFILLED_SYRINGE | INTRAVENOUS | Status: DC | PRN
  Filled 2023-04-21: qty 10

## 2023-04-21 MED ORDER — LACTATED RINGERS IV SOLN: INTRAVENOUS | Status: DC

## 2023-04-21 MED ORDER — LIDOCAINE HCL (PF) 1 % IJ SOLN
30.0000 mL | INTRAMUSCULAR | Status: DC | PRN
Start: 2023-04-21 — End: 2023-04-21

## 2023-04-21 MED ORDER — OXYTOCIN-SODIUM CHLORIDE 30-0.9 UT/500ML-% IV SOLN
1.0000 m[IU]/min | INTRAVENOUS | Status: DC
  Administered 2023-04-21: 2 m[IU]/min via INTRAVENOUS
  Filled 2023-04-21: qty 500

## 2023-04-21 MED ORDER — FENTANYL CITRATE (PF) 100 MCG/2ML IJ SOLN
50.0000 ug | INTRAMUSCULAR | Status: DC | PRN
  Administered 2023-04-21: 100 ug via INTRAVENOUS
  Filled 2023-04-21: qty 2

## 2023-04-21 MED ORDER — PENICILLIN G POT IN DEXTROSE 60000 UNIT/ML IV SOLN
3.0000 10*6.[IU] | INTRAVENOUS | Status: DC
  Administered 2023-04-21 – 2023-04-22 (×3): 3 10*6.[IU] via INTRAVENOUS
  Filled 2023-04-21 (×3): qty 50

## 2023-04-21 MED ORDER — OXYTOCIN-SODIUM CHLORIDE 30-0.9 UT/500ML-% IV SOLN: 2.5000 [IU]/h | INTRAVENOUS | Status: DC

## 2023-04-21 MED ORDER — MISOPROSTOL 50MCG HALF TABLET: 50.0000 ug | ORAL_TABLET | Freq: Once | ORAL | Status: DC

## 2023-04-21 MED ORDER — DIPHENHYDRAMINE HCL 50 MG/ML IJ SOLN
12.5000 mg | INTRAMUSCULAR | Status: DC | PRN
  Administered 2023-04-22 (×2): 12.5 mg via INTRAVENOUS
  Filled 2023-04-21 (×2): qty 1

## 2023-04-21 MED ORDER — TERBUTALINE SULFATE 1 MG/ML IJ SOLN: 0.2500 mg | Freq: Once | INTRAMUSCULAR | Status: DC | PRN

## 2023-04-21 MED ORDER — MISOPROSTOL 25 MCG QUARTER TABLET: 25.0000 ug | ORAL_TABLET | Freq: Once | ORAL | Status: DC

## 2023-04-21 MED ORDER — SOD CITRATE-CITRIC ACID 500-334 MG/5ML PO SOLN: 30.0000 mL | ORAL | Status: DC | PRN

## 2023-04-21 MED ORDER — OXYCODONE-ACETAMINOPHEN 5-325 MG PO TABS: 2.0000 | ORAL_TABLET | ORAL | Status: DC | PRN

## 2023-04-21 MED ORDER — OXYTOCIN-SODIUM CHLORIDE 30-0.9 UT/500ML-% IV SOLN
2.5000 [IU]/h | INTRAVENOUS | Status: DC
  Administered 2023-04-22 (×2): 2.5 [IU]/h via INTRAVENOUS
  Filled 2023-04-21: qty 500

## 2023-04-21 MED ORDER — ACETAMINOPHEN 325 MG PO TABS
650.0000 mg | ORAL_TABLET | ORAL | Status: DC | PRN
Start: 2023-04-21 — End: 2023-04-21

## 2023-04-21 NOTE — H&P (Signed)
Obstetrics Admission History & Physical  04/21/2023 - 2:03 PM Primary OBGYN: [facility]  Chief Complaint: SROM at 0700 today  History of Present Illness  22 y.o. G1 at [redacted]w[redacted]d, with the above CC. Pregnancy complicated by: Di-Di twins and mild pre-eclampsia diagnosed on 5/3  [name] states that she's had LOF since 0700. Not feeling any contractions, no s/s of pre-eclampsia  Review of Systems: as noted in the History of Present Illness.  Patient Active Problem List   Diagnosis Date Noted   Indication for care in labor and delivery, antepartum 04/21/2023   Mild pre-eclampsia 04/21/2023   Twin pregnancy, twins dichorionic and diamniotic 12/20/2022   Supervision of other normal pregnancy, antepartum 10/18/2022   MDD (major depressive disorder), recurrent episode, severe (HCC) 10/20/2020     PMHx:  Past Medical History:  Diagnosis Date   ADHD (attention deficit hyperactivity disorder)    no meds currently   Anxiety    Depression    History of seasonal allergies    Pregnancy induced hypertension    PSHx:  Past Surgical History:  Procedure Laterality Date   CHOLECYSTECTOMY N/A 01/29/2018   Procedure: LAPAROSCOPIC CHOLECYSTECTOMY WITH INTRAOPERATIVE CHOLANGIOGRAM;  Surgeon: Kandice Hams, MD;  Location: [facility] OR;  Service: Pediatrics;  Laterality: N/A;   Medications:  Medications Prior to Admission  Medication Sig Dispense Refill Last Dose   aspirin EC 81 MG tablet Take 1 tablet (81 mg total) by mouth daily. Take after 12 weeks for prevention of preeclampsia later in pregnancy 300 tablet 2 04/20/2023   cyclobenzaprine (FLEXERIL) 10 MG tablet Take 1 tablet (10 mg total) by mouth 2 (two) times daily as needed for muscle spasms. 20 tablet 0 04/20/2023   Prenatal 28-0.8 MG TABS Take 1 tablet by mouth daily. 30 tablet 12 04/20/2023   Blood Pressure Monitoring (BLOOD PRESSURE KIT) DEVI 1 Device by Does not apply route once a week. 1 each 0      Allergies: has No Known Allergies. OBHx:   OB History  Gravida Para Term Preterm AB Living  1 0 0 0 0 0  SAB IAB Ectopic Multiple Live Births  0 0 0 0 0    # Outcome Date GA Lbr Len/2nd Weight Sex Delivery Anes PTL Lv  1 Current                  FHx:  Family History  Problem Relation Age of Onset   Epilepsy Mother    Schizophrenia Father    Heart disease Brother    Asthma Brother    Epilepsy Brother    Hypertension Maternal Grandfather    Diabetes Maternal Grandfather    Cancer Paternal Grandfather    Soc Hx:  Social History   Socioeconomic History   Marital status: Single    Spouse name: Not on file   Number of children: Not on file   Years of education: Not on file   Highest education level: Not on file  Occupational History   Not on file  Tobacco Use   Smoking status: Never   Smokeless tobacco: Never  Vaping Use   Vaping Use: Never used  Substance and Sexual Activity   Alcohol use: Not Currently   Drug use: Not Currently    Types: Marijuana    Comment: last smoked 2 weeks ago (early October 2023)   Sexual activity: Not Currently    Birth control/protection: None  Other Topics Concern   Not on file  Social History Narrative  Patient lives at home with mother, 17yo brother, 13yo, 51yo and 7yo brothers. No smokers in the home. No pets in the home.   Social Determinants of Health   Financial Resource Strain: Low Risk  (01/03/2021)   Overall Financial Resource Strain (CARDIA)    Difficulty of Paying Living Expenses: Not hard at all  Food Insecurity: No Food Insecurity (01/03/2021)   Hunger Vital Sign    Worried About Running Out of Food in the Last Year: Never true    Ran Out of Food in the Last Year: Never true  Transportation Needs: No Transportation Needs (01/03/2021)   PRAPARE - Administrator, Civil Service (Medical): No    Lack of Transportation (Non-Medical): No  Physical Activity: Insufficiently Active (01/03/2021)   Exercise Vital Sign    Days of Exercise per Week: 2 days     Minutes of Exercise per Session: 60 min  Stress: No Stress Concern Present (01/03/2021)   Sonya [name] of Occupational Health - Occupational Stress Questionnaire    Feeling of Stress : Only a little  Social Connections: Socially Isolated (01/03/2021)   Social Connection and Isolation Panel [NHANES]    Frequency of Communication with Friends and Family: More than three times a week    Frequency of Social Gatherings with Friends and Family: Three times a week    Attends Religious Services: Never    Active Member of Clubs or Organizations: No    Attends Banker Meetings: Never    Marital Status: Never married  Intimate Partner Violence: Not At Risk (01/03/2021)   Humiliation, Afraid, Rape, and Kick questionnaire    Fear of Current or Ex-Partner: No    Emotionally Abused: No    Physically Abused: No    Sexually Abused: No    Objective  Patient Vitals for the past 12 hrs:  BP Temp Temp src Pulse Resp SpO2  04/21/23 1359 131/87 98.3 F (36.8 C) Oral 95 16 --  04/21/23 1330 132/82 -- -- (!) 103 -- --  04/21/23 1315 (!) 144/78 -- -- 93 -- --  04/21/23 1245 (!) 141/93 -- -- 95 -- 99 %  04/21/23 1230 132/83 -- -- (!) 111 16 --  04/21/23 1228 131/87 (!) 97.5 F (36.4 C) Oral (!) 106 16 --    Current Vital Signs 24h Vital Sign Ranges  T 98.3 F (36.8 C) Temp  Avg: 97.9 F (36.6 C)  Min: 97.5 F (36.4 C)  Max: 98.3 F (36.8 C)  BP 131/87 BP  Min: 131/87  Max: 144/78  HR 95 Pulse  Avg: 100.5  Min: 93  Max: 111  RR 16 Resp  Avg: 16  Min: 16  Max: 16  SaO2 99 %   SpO2  Avg: 99 %  Min: 99 %  Max: 99 %       24 Hour I/O Current Shift I/O  Time Ins Outs No intake/output data recorded. No intake/output data recorded.   EFM:  A 125 baseline, +accels, no decel, mod variability B 125 baseine, +accels, no decel, mod variability Toco: q3-82m  General: Well nourished, well developed female in no acute distress.  Skin:  Warm and dry.  Cardiovascular: S1, S2 normal, no  murmur, rub or gallop, regular rate and rhythm Respiratory:  Clear to auscultation bilateral. Normal respiratory effort Abdomen: gravid, nttp Neuro/Psych:  Normal mood and affect.   SVE: deferred Bedside u/s: cephalic/cephalic  Labs   Recent Labs  Lab 04/19/23 1338 04/21/23 1307  WBC  5.4 7.4  HGB 7.7* 9.2*  HCT 25.7* 31.4*  PLT 233 286    Recent Labs  Lab 04/19/23 1338  NA 136  K 4.0  CL 107  CO2 19*  BUN <5*  CREATININE 0.85  CALCIUM 8.1*  PROT 5.2*  BILITOT 0.4  ALKPHOS 179*  ALT 9  AST 19  GLUCOSE 86   Pending: CMP, O POS  Radiology 5/3: cephalic/cephalic on bpp 4/9:  Cephalic, 25%, 1782gm, ac 54%, bpp 8/8 Cephalic, 28%, 1810gm, ac 43%, bpp 8/8   Assessment & Plan   22 y.o. G1 @ [redacted]w[redacted]d with PPROM; pt stable *Pregnancy: category I tracing with accels x 2, fetal status reassuring *Di-Di twins: d/w pt re: twin delivery and approximately 15-20% chance of twin B turning to non vertex and need for breech extraction and risk of head entrapment with delivery. Patient amenable to trying for natural birth for both babies.  *Labor: check cervix once on L&D and offer augmentation PRN.  *Mild pre-eclampsia: f/u pending labs and BPs. No severe features currently *GBS: prelim is negative. Will start PCN and follow up final read *Analgesia: no needs currenty  Cornelia Copa MD Attending Center for [facility])

## 2023-04-21 NOTE — Progress Notes (Signed)
Labor Progress Note Sonya [name] is a 22 y.o. G1P0000 at [redacted]w[redacted]d presented for PPROM.   S: Doing well with no concerns at this time   O:  BP 133/81   Pulse 98   Temp 99.6 F (37.6 C) (Oral)   Resp 16   Ht 5\' 6"  (1.676 m)   Wt 83.9 kg   LMP 08/14/2022   SpO2 100%   BMI 29.86 kg/m  EFM: 140/135 bpm/moderate/+accels, no decels  CVE: Dilation: 4 Effacement (%): 70 Station: -1 Presentation: Vertex Exam by:: Dr. Foster Simpson   A&P: 22 y.o. G1P0000 [redacted]w[redacted]d here for PPROM.  #Labor: Progressing well, pitocin currently at 10. Ruptured forebag without difficulty  #Pain: Maternally supported #FWB: Cat I  #GBS not done, PCN ppx  Mild preE BP mild range.   Celedonio Savage, MD 11:06 PM

## 2023-04-21 NOTE — Progress Notes (Signed)
Introduced myself to the patient and visitors. The patient prefers to wait until her mother gets here to start her induction. Her mother is coming from out of town and is 50 mins away. I will plan to check her cervix at that time.   Lavonda Jumbo, DO OB Fellow, Faculty [facility], Center for [facility] 04/21/2023, 3:11 PM

## 2023-04-21 NOTE — MAU Note (Addendum)
...  Sonya [name] is a 22 y.o. at [redacted]w[redacted]d here in MAU reporting: Di/Di Twins. LOF since 0700 this morning. She reports she went back to sleep because she was not experiencing pain. She reports she woke back up around 45 minutes ago and noted the fluid had continued to leak. She reports the fluid is clear and does not have any odors. Denies VB. +FM for both.  Next OB appointment this upcoming Wednesday. IOL scheduled for 5/14.  Reports she was dx with Pre-E last week.  Onset of complaint: 0700 Pain score: Denies pain, endorses "heavy" pressure.  Lab orders placed from triage: Crist Fat

## 2023-04-21 NOTE — Progress Notes (Signed)
Labor Progress Note Sonya [name] is a 22 y.o. G1P0000 at [redacted]w[redacted]d presented for PPROM.   S: No acute concerns.   O:  BP (!) 138/95   Pulse (!) 106   Temp 99.3 F (37.4 C) (Oral)   Resp 18   Ht 5\' 6"  (1.676 m)   Wt 83.9 kg   LMP 08/14/2022   SpO2 99%   BMI 29.86 kg/m  EFM: 135/140 bpm/moderate/+accels, no decels  CVE: Dilation: 3 Effacement (%): 70 Station: -3 Presentation: Vertex Exam by:: MD [name]   A&P: 22 y.o. G1P0000 [redacted]w[redacted]d here for PPROM.  #Labor: Progressing well. Start pitocin 2 by 2.  #Pain: Maternally supported #FWB: Cat I  #GBS not done, PCN ppx  Mild preE BP mild range.   Sonya Doane Autry-Lott, DO 7:38 PM

## 2023-04-22 ENCOUNTER — Inpatient Hospital Stay (HOSPITAL_COMMUNITY): Payer: Medicaid Other | Admitting: Anesthesiology

## 2023-04-22 ENCOUNTER — Encounter (HOSPITAL_COMMUNITY): Payer: Self-pay | Admitting: Family Medicine

## 2023-04-22 DIAGNOSIS — O99344 Other mental disorders complicating childbirth: Secondary | ICD-10-CM

## 2023-04-22 DIAGNOSIS — Z3A35 35 weeks gestation of pregnancy: Secondary | ICD-10-CM

## 2023-04-22 DIAGNOSIS — O1404 Mild to moderate pre-eclampsia, complicating childbirth: Secondary | ICD-10-CM

## 2023-04-22 DIAGNOSIS — O42013 Preterm premature rupture of membranes, onset of labor within 24 hours of rupture, third trimester: Secondary | ICD-10-CM

## 2023-04-22 DIAGNOSIS — O30043 Twin pregnancy, dichorionic/diamniotic, third trimester: Secondary | ICD-10-CM

## 2023-04-22 LAB — CBC WITH DIFFERENTIAL/PLATELET
Abs Immature Granulocytes: 0.07 10*3/uL (ref 0.00–0.07)
Basophils Absolute: 0 10*3/uL (ref 0.0–0.1)
Basophils Relative: 0 %
Eosinophils Absolute: 0 10*3/uL (ref 0.0–0.5)
Eosinophils Relative: 0 %
HCT: 29.1 % — ABNORMAL LOW (ref 36.0–46.0)
Hemoglobin: 8.6 g/dL — ABNORMAL LOW (ref 12.0–15.0)
Immature Granulocytes: 1 %
Lymphocytes Relative: 9 %
Lymphs Abs: 0.9 10*3/uL (ref 0.7–4.0)
MCH: 23.1 pg — ABNORMAL LOW (ref 26.0–34.0)
MCHC: 29.6 g/dL — ABNORMAL LOW (ref 30.0–36.0)
MCV: 78.2 fL — ABNORMAL LOW (ref 80.0–100.0)
Monocytes Absolute: 0.7 10*3/uL (ref 0.1–1.0)
Monocytes Relative: 6 %
Neutro Abs: 8.6 10*3/uL — ABNORMAL HIGH (ref 1.7–7.7)
Neutrophils Relative %: 84 %
Platelets: 252 10*3/uL (ref 150–400)
RBC: 3.72 MIL/uL — ABNORMAL LOW (ref 3.87–5.11)
RDW: 16.1 % — ABNORMAL HIGH (ref 11.5–15.5)
WBC: 10.2 10*3/uL (ref 4.0–10.5)
nRBC: 0.2 % (ref 0.0–0.2)

## 2023-04-22 LAB — ABO/RH: ABO/RH(D): O POS

## 2023-04-22 LAB — GC/CHLAMYDIA PROBE AMP (~~LOC~~) NOT AT ARMC
Chlamydia: NEGATIVE
Comment: NEGATIVE
Comment: NORMAL
Neisseria Gonorrhea: NEGATIVE

## 2023-04-22 LAB — RPR: RPR Ser Ql: NONREACTIVE

## 2023-04-22 LAB — CULTURE, BETA STREP (GROUP B ONLY)

## 2023-04-22 MED ORDER — LIDOCAINE HCL (PF) 1 % IJ SOLN
INTRAMUSCULAR | Status: DC | PRN
  Administered 2023-04-22: 2 mL via EPIDURAL
  Administered 2023-04-22: 10 mL via EPIDURAL

## 2023-04-22 MED ORDER — METHYLERGONOVINE MALEATE 0.2 MG/ML IJ SOLN
0.2000 mg | Freq: Once | INTRAMUSCULAR | Status: AC
  Administered 2023-04-22: 0.2 mg via INTRAMUSCULAR

## 2023-04-22 MED ORDER — TRANEXAMIC ACID-NACL 1000-0.7 MG/100ML-% IV SOLN: 1000.0000 mg | INTRAVENOUS | Status: AC

## 2023-04-22 MED ORDER — FUROSEMIDE 20 MG PO TABS
20.0000 mg | ORAL_TABLET | Freq: Every day | ORAL | Status: DC
  Administered 2023-04-22 – 2023-04-23 (×2): 20 mg via ORAL
  Filled 2023-04-22 (×2): qty 1

## 2023-04-22 MED ORDER — DIBUCAINE (PERIANAL) 1 % EX OINT: 1.0000 | TOPICAL_OINTMENT | CUTANEOUS | Status: DC | PRN

## 2023-04-22 MED ORDER — SENNOSIDES-DOCUSATE SODIUM 8.6-50 MG PO TABS
2.0000 | ORAL_TABLET | ORAL | Status: DC
  Administered 2023-04-22 – 2023-04-23 (×2): 2 via ORAL
  Filled 2023-04-22 (×2): qty 2

## 2023-04-22 MED ORDER — IBUPROFEN 600 MG PO TABS
600.0000 mg | ORAL_TABLET | Freq: Four times a day (QID) | ORAL | Status: DC
  Administered 2023-04-22 – 2023-04-23 (×6): 600 mg via ORAL
  Filled 2023-04-22 (×6): qty 1

## 2023-04-22 MED ORDER — SIMETHICONE 80 MG PO CHEW: 80.0000 mg | CHEWABLE_TABLET | ORAL | Status: DC | PRN

## 2023-04-22 MED ORDER — WITCH HAZEL-GLYCERIN EX PADS
1.0000 | MEDICATED_PAD | CUTANEOUS | Status: DC | PRN
  Administered 2023-04-22: 1 via TOPICAL

## 2023-04-22 MED ORDER — BENZOCAINE-MENTHOL 20-0.5 % EX AERO
1.0000 | INHALATION_SPRAY | CUTANEOUS | Status: DC | PRN
  Administered 2023-04-22 – 2023-04-23 (×2): 1 via TOPICAL
  Filled 2023-04-22 (×2): qty 56

## 2023-04-22 MED ORDER — TRANEXAMIC ACID-NACL 1000-0.7 MG/100ML-% IV SOLN
INTRAVENOUS | Status: AC
  Administered 2023-04-22: 1000 mg via INTRAVENOUS
  Filled 2023-04-22: qty 100

## 2023-04-22 MED ORDER — MISOPROSTOL 200 MCG PO TABS: 600.0000 ug | ORAL_TABLET | Freq: Once | ORAL | Status: DC

## 2023-04-22 MED ORDER — ONDANSETRON HCL 4 MG PO TABS: 4.0000 mg | ORAL_TABLET | ORAL | Status: DC | PRN

## 2023-04-22 MED ORDER — TETANUS-DIPHTH-ACELL PERTUSSIS 5-2.5-18.5 LF-MCG/0.5 IM SUSY: 0.5000 mL | PREFILLED_SYRINGE | Freq: Once | INTRAMUSCULAR | Status: DC

## 2023-04-22 MED ORDER — METHYLERGONOVINE MALEATE 0.2 MG/ML IJ SOLN
INTRAMUSCULAR | Status: AC
  Filled 2023-04-22: qty 1

## 2023-04-22 MED ORDER — ONDANSETRON HCL 4 MG/2ML IJ SOLN: 4.0000 mg | INTRAMUSCULAR | Status: DC | PRN

## 2023-04-22 MED ORDER — ACETAMINOPHEN 325 MG PO TABS: 650.0000 mg | ORAL_TABLET | ORAL | Status: DC | PRN

## 2023-04-22 MED ORDER — ZOLPIDEM TARTRATE 5 MG PO TABS: 5.0000 mg | ORAL_TABLET | Freq: Every evening | ORAL | Status: DC | PRN

## 2023-04-22 MED ORDER — NIFEDIPINE ER OSMOTIC RELEASE 30 MG PO TB24
30.0000 mg | ORAL_TABLET | Freq: Every day | ORAL | Status: DC
  Administered 2023-04-22 – 2023-04-23 (×2): 30 mg via ORAL
  Filled 2023-04-22 (×2): qty 1

## 2023-04-22 MED ORDER — FENTANYL-BUPIVACAINE-NACL 0.5-0.125-0.9 MG/250ML-% EP SOLN
EPIDURAL | Status: DC | PRN
  Administered 2023-04-22: 12 mL/h via EPIDURAL

## 2023-04-22 MED ORDER — PRENATAL MULTIVITAMIN CH
1.0000 | ORAL_TABLET | Freq: Every day | ORAL | Status: DC
  Administered 2023-04-22 – 2023-04-23 (×2): 1 via ORAL
  Filled 2023-04-22 (×2): qty 1

## 2023-04-22 MED ORDER — DIPHENHYDRAMINE HCL 25 MG PO CAPS: 25.0000 mg | ORAL_CAPSULE | Freq: Four times a day (QID) | ORAL | Status: DC | PRN

## 2023-04-22 MED ORDER — COCONUT OIL OIL: 1.0000 | TOPICAL_OIL | Status: DC | PRN

## 2023-04-22 NOTE — Discharge Summary (Signed)
Postpartum Discharge Summary  Date of Service updated***     Patient Name: Sonya [name] DOB: 2001/08/02 MRN: [id number]  Date of admission: 04/21/2023 Delivery date:   Yanielis, Junek [[id number]]  04/22/2023    Kimbly, Keville [[id number]]  04/22/2023  Delivering provider:    Hinton Dyer Chassidy [[id number]]  VENIE, GUNNELL, BoyB Anijah [[id number]]  Marcheta Grammes V  Date of discharge: 04/22/2023  Admitting diagnosis: Indication for care in labor and delivery, antepartum [O75.9] Preeclampsia, third trimester [O14.93] Intrauterine pregnancy: [redacted]w[redacted]d     Secondary diagnosis:  Principal Problem:   Vaginal delivery Active Problems:   Twin pregnancy, twins dichorionic and diamniotic   Mild pre-eclampsia   Anemia   First degree perineal laceration during delivery, delivered   Periurethral trauma during delivery  Additional problems: ***    Discharge diagnosis: Preterm Pregnancy Delivered and Preeclampsia (mild)                                              Post partum procedures:{Postpartum procedures:23558} Augmentation: AROM and Pitocin Complications: None  Hospital course: Onset of Labor With Vaginal Delivery      22 y.o. yo G1P0000 at [redacted]w[redacted]d was admitted in Latent Labor on 04/21/2023. Labor course was complicated by direct OP deliveries of both twin A and twin B. Membrane Rupture Time/Date:    Yaresly, Kimmelman [[id number]]  9:38 PM    Aayliah, Relihan Sanaiyah [[id number]]  7:00 AM ,   Adisa, Deegan [[id number]]  04/21/2023    Christella, Lowenthal [[id number]]  04/21/2023   Delivery Method:   Ala Dach [[id number]]  Vaginal, Spontaneous    Kaylonnie, Sturges Gracelynne [[id number]]  Vaginal, Vacuum (Extractor)  Episiotomy:    Janesha, Daddio [[id number]]  None    Dennice, Frierson [place] [[id number]]  None  Lacerations:     Quinita, Hook [[id number]]  1st degree;Periurethral    Zelah, Wyland [[id number]]   1st degree;Periurethral  Patient had a postpartum course complicated by ***.  She is ambulating, tolerating a regular diet, passing flatus, and urinating well. Patient is discharged home in stable condition on 04/22/23.  Newborn Data: Birth date:   Shawnay, Eaken [[id number]]  04/22/2023    Shala, Kolasinski [[id number]]  04/22/2023  Birth time:   Iyahna, Langsam [[id number]]  5:06 AM    Inda Merlin Calah [[id number]]  5:22 AM  Gender:   Tinslee, Derocco [[id number]]  Female    Anieya, Pliego [place] [[id number]]  Female  Living status:   Tuwanda, Meaker [[id number]]  QMGQQP    YPPJKDTO, IZTI WPYKDX [[id number]]  Living  Apgars:   Katiann, Hubby [[id number]]  [address] [place] [[id number]]  8 ,   Avalynne, Komm [[id number]]  [address] [place] [[id number]]  9  Weight:   Conor, Camhi [[id number]]  2480 g    Jeannetta, Luiz [[id number]]  2330 g   Magnesium Sulfate received: No BMZ received: No Rhophylac:N/A MMR:No T-DaP: ordered postpartum Flu: N/A Transfusion:{Transfusion received:30440034}  Physical exam  Vitals:   04/22/23 0500 04/22/23 0605 04/22/23 0618 04/22/23 0635  BP: 127/74 139/60 (!) 143/95 (!) 145/89  Pulse: 86 91 81 69  Resp:  18    Temp:  98.7 F (37.1 C)    TempSrc:  Oral  SpO2:      Weight:      Height:       General: {Exam; general:21111117} Lochia: {Desc; appropriate/inappropriate:30686::"appropriate"} Uterine Fundus: {Desc; firm/soft:30687} Incision: {Exam; incision:21111123} DVT Evaluation: {Exam; ZOX:0960454} Labs: Lab Results  Component Value Date   WBC 7.3 04/21/2023   HGB 8.6 (L) 04/21/2023   HCT 28.4 (L) 04/21/2023   MCV 76.8 (L) 04/21/2023   PLT 257 04/21/2023      Latest Ref Rng & Units 04/21/2023    1:07 PM  CMP  Glucose 70 - 99 mg/dL 63   BUN 6 - 20 mg/dL 8   Creatinine 0.98 - 1.19 mg/dL 1.47   Sodium 829 - 562 mmol/L 134   Potassium 3.5 -  5.1 mmol/L 3.9   Chloride 98 - 111 mmol/L 104   CO2 22 - 32 mmol/L 17   Calcium 8.9 - 10.3 mg/dL 8.5   Total Protein 6.5 - 8.1 g/dL 6.0   Total Bilirubin 0.3 - 1.2 mg/dL 0.7   Alkaline Phos 38 - 126 U/L 223   AST 15 - 41 U/L 33   ALT 0 - 44 U/L 11    Edinburgh Score:     No data to display           After visit meds:  Allergies as of 04/22/2023   No Known Allergies   Med Rec must be completed prior to using this [facility]***        Discharge home in stable condition Infant Feeding: {Baby feeding:23562} Infant Disposition:{CHL IP OB HOME WITH ZHYQMV:78469} Discharge instruction: per After Visit Summary and Postpartum booklet. Activity: Advance as tolerated. Pelvic rest for 6 weeks.  Diet: {OB GEXB:28413244} Future Appointments: Future Appointments  Date Time Provider Department Center  04/23/2023 10:15 AM [facility] NURSE [facility]  04/23/2023 10:30 AM [facility] US2 [facility]  04/24/2023  9:35 AM Lennart Pall, MD [facility] None  04/30/2023 10:15 AM [facility] NURSE [facility]  04/30/2023 10:30 AM [facility] US2 [facility]  05/07/2023 10:15 AM [facility] NURSE [facility]  05/07/2023 10:30 AM [facility] US2 [facility]   Follow up Visit:   Please schedule this patient for a In person postpartum visit in 6 weeks with the following provider: Any provider. Additional Postpartum F/U:BP check 1 week  High risk pregnancy complicated by: HTN Delivery mode:     Emma, Gruman [[id number]]  Vaginal, Spontaneous    Kwame, Carro [[id number]]  Vaginal, Vacuum (Extractor)  Anticipated Birth Control:  Unsure   04/22/2023 Celedonio Savage, MD

## 2023-04-22 NOTE — Lactation Note (Signed)
This note was copied from a baby's chart. Lactation Consultation Note  Patient Name: Sonya [name] ZOXWR'U Date: 04/22/2023 Age:20 hours Reason for consult: Initial assessment;1st time breastfeeding;Infant < 6lbs;Late-preterm 34-36.6wks;Multiple gestation  [name] Baby A had another stool while LC in room, had medium emesis after taking 6 mls of 22 kcal formula that took 20 minutes to bottle  feed. SLP consult was placed. Infant using Lavender Nfant nipple. Infant's suck is uncoordinated, immature, with gaging. RN feed Baby B while LC assisted with feeding Baby A. Baby A had 2 voids and 2 stools since Birth. Hx low blood sugar see results review.   [name] Baby B had two voids and one stool since birth. RN bottle feed Baby B, infant consumed only 2  ml of 22 kcal formula.  [facility] set Birth Parent up with DEBP. Birth Parent was pumping while LC was in the room, Birth Parent knows to pump every 3 hours for 15 minutes at room temperature, Birth Parent expressed 5 mls when using pump and knows that her EBM is safe at room temperature for 4 hours whereas formula once open must be used within 1 hour.  Birth Parent will work towards bottle feeding multiples  at this time due poor feedings and "pump only "and "formula feeding."  LC discussed LPTI feeding behaviors, Birth Parent given "MY Care Plan" understands goal is to  pace feed multiples and work towards then consuming 12-14 mls of EBM/ 22 kcal formula per feeding. Birth Parent knows to feeding infant every 3 hours and limit total feedings to 30 minutes or less.   Maternal Data Has patient been taught Hand Expression?: Yes Does the patient have breastfeeding experience prior to this delivery?: No  Feeding Mother's Current Feeding Choice: Breast Milk and Formula Nipple Type: Nfant Slow Flow (purple)  LATCH Score  No latch at this time.                   Lactation Tools Discussed/Used Tools: Pump Breast pump  type: Double-Electric Breast Pump Pump Education: Setup, frequency, and cleaning;Milk Storage Reason for Pumping: LPTI, Less than 6 lbs, tongue thrusting not bottle feeding well, to help stimulate and establish Birth Parent's milk supply. Pumping frequency: Birth Parent will continue to pump every 3 hours for 15 minutes on inital settting. Pumped volume: 5 mL  Interventions Interventions: Breast feeding basics reviewed;Skin to skin;LPT handout/interventions;Education;DEBP  Discharge    Consult Status Consult Status: Follow-up Date: 04/23/23 Follow-up type: In-patient    Sonya [name] 04/22/2023, 4:15 PM

## 2023-04-22 NOTE — Progress Notes (Signed)
   04/22/23 1107  What Happened  Was fall witnessed? Yes  Who witnessed fall? Ellen Henri NT  Patients activity before fall bathroom-assisted  Point of contact other (comment) (eblow/hand)  Was patient injured? No  Provider Notification  Provider Name/Title Crissie Reese, MD  Date Provider Notified 04/22/23  Time Provider Notified 1130  Method of Notification Call  Notification Reason Fall  Provider response No new orders  Date of Provider Response 04/22/23  Time of Provider Response 1130  Follow Up  Family notified Yes - comment  Time family notified 1100 (SO present in room)  Additional tests No  Vitals  Temp 98.3 F (36.8 C)  Temp Source Oral  BP (!) 161/94  BP Location Left Arm  BP Method Automatic  Patient Position (if appropriate) Semi-fowlers  Pulse Rate 71  Pulse Rate Source Dinamap  Resp 18  Oxygen Therapy  SpO2 100 %

## 2023-04-22 NOTE — Anesthesia Procedure Notes (Signed)
Epidural Patient location during procedure: OB Start time: 04/22/2023 1:18 AM End time: 04/22/2023 1:28 AM  Staffing Anesthesiologist: Lannie Fields, DO Performed: anesthesiologist   Preanesthetic Checklist Completed: patient identified, IV checked, risks and benefits discussed, monitors and equipment checked, pre-op evaluation and timeout performed  Epidural Patient position: sitting Prep: DuraPrep and site prepped and draped Patient monitoring: continuous pulse ox, blood pressure, heart rate and cardiac monitor Approach: midline Location: L3-L4 Injection technique: LOR air  Needle:  Needle type: Tuohy  Needle gauge: 17 G Needle length: 9 cm Needle insertion depth: 5 cm Catheter type: closed end flexible Catheter size: 19 Gauge Catheter at skin depth: 10 cm Test dose: negative  Assessment Sensory level: T8 Events: blood not aspirated, no cerebrospinal fluid, injection not painful, no injection resistance, no paresthesia and negative IV test  Additional Notes Patient identified. Risks/Benefits/Options discussed with patient including but not limited to bleeding, infection, nerve damage, paralysis, failed block, incomplete pain control, headache, blood pressure changes, nausea, vomiting, reactions to medication both or allergic, itching and postpartum back pain. Confirmed with bedside nurse the patient's most recent platelet count. Confirmed with patient that they are not currently taking any anticoagulation, have any bleeding history or any family history of bleeding disorders. Patient expressed understanding and wished to proceed. All questions were answered. Sterile technique was used throughout the entire procedure. Please see nursing notes for vital signs. Test dose was given through epidural catheter and negative prior to continuing to dose epidural or start infusion. Warning signs of high block given to the patient including shortness of breath, tingling/numbness in  hands, complete motor block, or any concerning symptoms with instructions to call for help. Patient was given instructions on fall risk and not to get out of bed. All questions and concerns addressed with instructions to call with any issues or inadequate analgesia.  Reason for block:procedure for pain

## 2023-04-22 NOTE — Progress Notes (Signed)
Provider to bedside to relieve V. Cresenzo for stat c/s.  Vaginal repair complete and patient about to receive methergine per protocol.  Patient with continued trickling of blood.  LUS sweep yields ~ 50mL of clots.  of buccal cytotec ordered.  Counts correct Laps and instruments.  Patient reports desire for infant circumcision. Does not know what birth control she desires.  Transfer orders placed.

## 2023-04-22 NOTE — Lactation Note (Signed)
This note was copied from a baby's chart. Lactation Consultation Note  Patient Name: Sonya [name] ZOXWR'U Date: 04/22/2023 Age:20 hours Reason for consult: L&D Initial assessment  L&D consult with twins per L&D RN request. Congratulated family on newborn.  Infants are in warmer at this time. Demonstrated hand expression, drops noted.  BoyA ([place]): 1mL of spoonfed colostrum BoyB [facility]): 1mL of spoonfed colostrum Discussed the importance of skin to skin for LPTI. Educated about LPTI/LBW supplementation needs and how to preserve infants' energy. Talked about primal reflexes. Explained LC services availability during postpartum stay. Thanked family for their time.   Maternal Data Has patient been taught Hand Expression?: Yes Does the patient have breastfeeding experience prior to this delivery?: No  Feeding Mother's Current Feeding Choice: Breast Milk  LATCH Score No latch observed during this encounter.   Interventions Interventions: Breast feeding basics reviewed;Skin to skin;Breast massage;Hand express;Expressed milk;Education;LPT handout/interventions  Discharge Pump: Personal Quarry manager wearable) WIC Program: No  Consult Status Consult Status: Follow-up from L&D Date: 04/22/23 Follow-up type: In-patient    [name] A Higuera Ancidey 04/22/2023, 7:57 AM

## 2023-04-22 NOTE — Anesthesia Preprocedure Evaluation (Addendum)
Anesthesia Evaluation  Patient identified by MRN, date of birth, ID band Patient awake    Reviewed: Allergy & Precautions, Patient's Chart, lab work & pertinent test results  Airway Mallampati: II  TM Distance: >3 FB Neck ROM: Full    Dental no notable dental hx.    Pulmonary neg pulmonary ROS   Pulmonary exam normal breath sounds clear to auscultation       Cardiovascular hypertension (preE w/o SF), Normal cardiovascular exam Rhythm:Regular Rate:Normal     Neuro/Psych  PSYCHIATRIC DISORDERS Anxiety Depression    negative neurological ROS     GI/Hepatic negative GI ROS, Neg liver ROS,,,  Endo/Other  negative endocrine ROS    Renal/GU negative Renal ROS  negative genitourinary   Musculoskeletal negative musculoskeletal ROS (+)    Abdominal   Peds  Hematology  (+) Blood dyscrasia, anemia Hb 8.6, plt 257   Anesthesia Other Findings   Reproductive/Obstetrics (+) Pregnancy twins                             Anesthesia Physical Anesthesia Plan  ASA: 3  Anesthesia Plan: Epidural   Post-op Pain Management:    Induction:   PONV Risk Score and Plan: 2  Airway Management Planned: Natural Airway  Additional Equipment: None  Intra-op Plan:   Post-operative Plan:   Informed Consent: I have reviewed the patients History and Physical, chart, labs and discussed the procedure including the risks, benefits and alternatives for the proposed anesthesia with the patient or authorized representative who has indicated his/her understanding and acceptance.       Plan Discussed with:   Anesthesia Plan Comments:        Anesthesia Quick Evaluation

## 2023-04-23 ENCOUNTER — Ambulatory Visit: Payer: Medicaid Other

## 2023-04-23 ENCOUNTER — Other Ambulatory Visit: Payer: Self-pay

## 2023-04-23 LAB — CBC
HCT: 22.7 % — ABNORMAL LOW (ref 36.0–46.0)
Hemoglobin: 7.2 g/dL — ABNORMAL LOW (ref 12.0–15.0)
MCH: 23.8 pg — ABNORMAL LOW (ref 26.0–34.0)
MCHC: 31.7 g/dL (ref 30.0–36.0)
MCV: 74.9 fL — ABNORMAL LOW (ref 80.0–100.0)
Platelets: 215 10*3/uL (ref 150–400)
RBC: 3.03 MIL/uL — ABNORMAL LOW (ref 3.87–5.11)
RDW: 15.9 % — ABNORMAL HIGH (ref 11.5–15.5)
WBC: 11.9 10*3/uL — ABNORMAL HIGH (ref 4.0–10.5)
nRBC: 0.3 % — ABNORMAL HIGH (ref 0.0–0.2)

## 2023-04-23 LAB — CULTURE, BETA STREP (GROUP B ONLY)

## 2023-04-23 MED ORDER — IBUPROFEN 600 MG PO TABS: 600.0000 mg | ORAL_TABLET | Freq: Four times a day (QID) | ORAL | 0 refills | Status: DC

## 2023-04-23 MED ORDER — FERROUS SULFATE 325 (65 FE) MG PO TABS: 325.0000 mg | ORAL_TABLET | ORAL | 3 refills | Status: DC

## 2023-04-23 MED ORDER — NIFEDIPINE ER 30 MG PO TB24: 30.0000 mg | ORAL_TABLET | Freq: Every day | ORAL | 0 refills | Status: DC

## 2023-04-23 MED ORDER — SENNOSIDES-DOCUSATE SODIUM 8.6-50 MG PO TABS: 2.0000 | ORAL_TABLET | Freq: Every day | ORAL | 0 refills | Status: DC

## 2023-04-23 MED ORDER — FERROUS SULFATE 325 (65 FE) MG PO TABS
325.0000 mg | ORAL_TABLET | ORAL | Status: DC
  Administered 2023-04-23: 325 mg via ORAL
  Filled 2023-04-23: qty 1

## 2023-04-23 MED ORDER — FUROSEMIDE 20 MG PO TABS: 20.0000 mg | ORAL_TABLET | Freq: Every day | ORAL | 0 refills | Status: DC

## 2023-04-23 MED ORDER — ACETAMINOPHEN 325 MG PO TABS: 650.0000 mg | ORAL_TABLET | ORAL | 0 refills | Status: DC | PRN

## 2023-04-23 MED ORDER — BENZOCAINE-MENTHOL 20-0.5 % EX AERO: 1.0000 | INHALATION_SPRAY | CUTANEOUS | 0 refills | Status: DC | PRN

## 2023-04-23 NOTE — Progress Notes (Signed)
POSTPARTUM PROGRESS NOTE  Post Partum Day 1  Subjective:  Sonya [name] is a 22 y.o. [id number] s/p VD at [redacted]w[redacted]d.  She reports she is doing well. No acute events overnight. She denies any problems with ambulating, voiding or po intake. Denies nausea or vomiting.  Pain is well controlled.  Lochia is adequate.  Objective: Blood pressure 133/78, pulse 100, temperature 97.8 F (36.6 C), temperature source Oral, resp. rate 18, height 5\' 6"  (1.676 m), weight 83.9 kg, last menstrual period 08/14/2022, SpO2 100 %, unknown if currently breastfeeding.  Physical Exam:  General: alert, cooperative and no distress Chest: no respiratory distress Heart:regular rate, distal pulses intact Uterine Fundus: firm, appropriately tender DVT Evaluation: No calf swelling or tenderness Extremities: no edema Skin: warm, dry  Recent Labs    04/22/23 0724 04/23/23 0554  HGB 8.6* 7.2*  HCT 29.1* 22.7*    Assessment/Plan: Sonya [name] is a 22 y.o. [id number] s/p VD at [redacted]w[redacted]d   PPD#1 - Doing well  Routine postpartum care  PO iron Contraception: patch Feeding: breast/bottle Dispo: Plan for discharge tomorrow.   LOS: 2 days   Myrtie Hawk, DO OB Fellow  04/23/2023, 3:54 PM

## 2023-04-23 NOTE — Social Work (Signed)
[name] was referred for history of depression/anxiety.  * Referral screened out by Clinical Social Worker because none of the following criteria appear to apply:  ~ History of anxiety/depression during this pregnancy, or of post-partum depression following prior delivery.  ~ Diagnosis of anxiety and/or depression within last 3 years OR * [name]'s symptoms currently being treated with medication and/or therapy.  Per chart review, [name] diagnosis was in 2021. Per OB records no symptoms noted during pregnancy.    Please contact the Clinical Social Worker if needs arise, by [facility] request, or if [name] scores greater than 9/yes to question 10 on Edinburgh Postpartum Depression Screen.  Sonya [name], LCSWA Clinical Social Worker [phone]

## 2023-04-23 NOTE — Anesthesia Postprocedure Evaluation (Signed)
Anesthesia Post Note  Patient: Sonya [name]  Procedure(s) Performed: AN AD HOC LABOR EPIDURAL     Patient location during evaluation: Mother Baby Anesthesia Type: Epidural Level of consciousness: awake, awake and alert and oriented Pain management: pain level controlled Vital Signs Assessment: post-procedure vital signs reviewed and stable Respiratory status: spontaneous breathing, nonlabored ventilation and respiratory function stable Cardiovascular status: blood pressure returned to baseline and stable Postop Assessment: no headache, no backache, able to ambulate, adequate PO intake and no apparent nausea or vomiting Anesthetic complications: no   No notable events documented.  Last Vitals:  Vitals:   04/22/23 1715 04/22/23 2116  BP: 132/77 134/79  Pulse: 88 99  Resp: 18 18  Temp: 37.2 C (!) 36.4 C  SpO2: 100% 100%    Last Pain:  Vitals:   04/23/23 0158  TempSrc:   PainSc: 6    Pain Goal:                   [name]

## 2023-04-24 ENCOUNTER — Encounter: Payer: Medicaid Other | Admitting: Obstetrics and Gynecology

## 2023-04-26 ENCOUNTER — Encounter (HOSPITAL_COMMUNITY): Payer: Self-pay | Admitting: Obstetrics and Gynecology

## 2023-04-26 ENCOUNTER — Inpatient Hospital Stay (HOSPITAL_COMMUNITY)
Admission: AD | Admit: 2023-04-26 | Discharge: 2023-04-27 | Disposition: A | Discharge status: Home / Self Care | Payer: Medicaid Other | Attending: Obstetrics and Gynecology | Admitting: Obstetrics and Gynecology

## 2023-04-26 ENCOUNTER — Ambulatory Visit (HOSPITAL_COMMUNITY): Payer: Self-pay

## 2023-04-26 DIAGNOSIS — R531 Weakness: Secondary | ICD-10-CM | POA: Diagnosis not present

## 2023-04-26 DIAGNOSIS — R109 Unspecified abdominal pain: Secondary | ICD-10-CM | POA: Diagnosis present

## 2023-04-26 DIAGNOSIS — O99893 Other specified diseases and conditions complicating puerperium: Secondary | ICD-10-CM | POA: Diagnosis not present

## 2023-04-26 DIAGNOSIS — O9081 Anemia of the puerperium: Secondary | ICD-10-CM | POA: Diagnosis not present

## 2023-04-26 DIAGNOSIS — D649 Anemia, unspecified: Secondary | ICD-10-CM | POA: Diagnosis not present

## 2023-04-26 NOTE — Lactation Note (Signed)
This note was copied from a baby's chart.  NICU Lactation Consultation Note  Patient Name: Sonya [name] [id number] Date: 04/26/2023 Age:20 days  Reason for consult: Follow-up assessment; NICU baby; Multiple gestation; Late-preterm 34-36.6wks  SUBJECTIVE  LC in to visit with P2 Mom of LPT twins transferred to NICU on day 2 pp.   Baby are being gavage fed and bottle fed.  Mom in recliner with baby A "Sonya [name]".  Baby wasn't interested in bottle feeding at this feeding.  Mom would like to try to breastfeed, but didn't know if she could.  Mom has been pumping every 3 hrs and her volume is in as she has pumped 3 times for 120 ml each time.    Offered to assist with positioning and latching.  Mom also wanted to do STS with babies.  LC assisted with STS and baby "Sonya [name]" placed in cross cradle hold.  Baby showing subtle cues, but unable to open wide enough to latch and appears very sleepy.  Lots of reassurance and education on normal LPTI feeding behavior.  Noted in SLP note that baby A has a tongue tie.  LC set up DEBP in room as Mom has been using her Hand's Free Double pump.  Educated on the importance of using a hospital grade pump for supporting her milk supply.  Plan- 1- STS as much as possible 2-Offer the breast with cues 3- Pump both breasts on maintenance setting for 20-30 mins. 4- ask for help prn with positioning and latching   OBJECTIVE Infant data: No data recorded Infant feeding assessment Scale for Readiness: 3 Scale for Quality: 3   Maternal data: G1P0102  Vaginal, Spontaneous Pumped volume: 120 mL Flange Size: 21  WIC Program: No WIC Referral Sent?: Yes Pump: Hands Free, Personal (MomCozy)  ASSESSMENT Infant: LATCH Documentation Latch: 0 Audible Swallowing: 0 Type of Nipple: 2 (short nipple shafts, compressible areola) Comfort (Breast/Nipple): 2 Hold (Positioning): 0 LATCH Score: 4  Feeding Status: Scheduled 8-11-2-5  Maternal: Milk volume:  Normal  INTERVENTIONS/PLAN Interventions: Interventions: Breast feeding basics reviewed; Assisted with latch; Skin to skin; Breast massage; Hand express; Adjust position; Support pillows; Position options; Expressed milk; DEBP; Education Tools: Pump; Flanges; Bottle Pump Education: Setup, frequency, and cleaning; Milk Storage  Plan: Consult Status: NICU follow-up NICU Follow-up type: Weekly NICU follow up   Sonya [name] 04/26/2023, 11:18 AM

## 2023-04-26 NOTE — MAU Note (Addendum)
Pt says she del vag on 04-22-2023- twins  Both babies are in NICU  Pt was in NICU and came here for abd pain Took Ibuprofen 600mg   at 10pm Pain now 0/10-  pain comes and goes Was not on Mag  Breastfeeding

## 2023-04-26 NOTE — MAU Provider Note (Signed)
History     CSN: [id number]  Arrival date and time: 04/26/23 2228   Event Date/Time   First Provider Initiated Contact with Patient 04/26/23 2313      No chief complaint on file.  HPI  Sonya [name] is a 22 y.o. G1P0102 postpartum from a vaginal delivery on 5/6 who presents for evaluation of abdominal pain and feeling generally unwell. Patient reports she has intermittent all over abdominal pain. She reports she takes ibuprofen and it helps the pain. She denies any pain at this time. She reports she feels weak, tired and generally well. She reports she has been staying in the hospital with the babies in the NICU. She reports she is taking both her procardia and lasix as prescribed. She denies any HA, visual changes or epigastric pain.   OB History     Gravida  1   Para  1   Term  0   Preterm  1   AB  0   Living  2      SAB  0   IAB  0   Ectopic  0   Multiple  1   Live Births  2           Past Medical History:  Diagnosis Date   ADHD (attention deficit hyperactivity disorder)    no meds currently   Anxiety    Cholelithiasis 12/30/2017   Depression    History of seasonal allergies    Pregnancy induced hypertension     Past Surgical History:  Procedure Laterality Date   CHOLECYSTECTOMY N/A 01/29/2018   Procedure: LAPAROSCOPIC CHOLECYSTECTOMY WITH INTRAOPERATIVE CHOLANGIOGRAM;  Surgeon: Kandice Hams, MD;  Location: [facility] OR;  Service: Pediatrics;  Laterality: N/A;    Family History  Problem Relation Age of Onset   Epilepsy Mother    Schizophrenia Father    Heart disease Brother    Asthma Brother    Epilepsy Brother    Hypertension Maternal Grandfather    Diabetes Maternal Grandfather    Cancer Paternal Grandfather     Social History   Tobacco Use   Smoking status: Never   Smokeless tobacco: Never  Vaping Use   Vaping Use: Never used  Substance Use Topics   Alcohol use: Not Currently   Drug use: Not Currently    Types: Marijuana     Comment: last smoked 2 weeks ago (early October 2023)    Allergies: No Known Allergies  Medications Prior to Admission  Medication Sig Dispense Refill Last Dose   ferrous sulfate 325 (65 FE) MG tablet Take 1 tablet (325 mg total) by mouth every other day. 30 tablet 3 04/26/2023   furosemide (LASIX) 20 MG tablet Take 1 tablet (20 mg total) by mouth daily for 4 days. 4 tablet 0 04/26/2023   ibuprofen (ADVIL) 600 MG tablet Take 1 tablet (600 mg total) by mouth every 6 (six) hours. 30 tablet 0 04/26/2023 at 2200   NIFEdipine (ADALAT CC) 30 MG 24 hr tablet Take 1 tablet (30 mg total) by mouth daily. 30 tablet 0 04/26/2023   acetaminophen (TYLENOL) 325 MG tablet Take 2 tablets (650 mg total) by mouth every 4 (four) hours as needed (for pain scale < 4). 30 tablet 0    benzocaine-Menthol (DERMOPLAST) 20-0.5 % AERO Apply 1 Application topically as needed for irritation (perineal discomfort). 78 g 0    senna-docusate (SENOKOT-S) 8.6-50 MG tablet Take 2 tablets by mouth daily. 30 tablet 0     Review of Systems  Constitutional: Negative.  Negative for fatigue and fever.  HENT: Negative.    Respiratory: Negative.  Negative for shortness of breath.   Cardiovascular: Negative.  Negative for chest pain.  Gastrointestinal:  Positive for abdominal pain. Negative for constipation, diarrhea, nausea and vomiting.  Genitourinary: Negative.  Negative for dysuria, vaginal bleeding and vaginal discharge.  Neurological:  Positive for weakness. Negative for dizziness and headaches.   Physical Exam   Blood pressure 133/64, pulse 73, temperature 98.1 F (36.7 C), temperature source Oral, resp. rate 17, height 5\' 6"  (1.676 m), weight 75.9 kg, SpO2 99 %, unknown if currently breastfeeding.  Patient Vitals for the past 24 hrs:  BP Temp Temp src Pulse Resp SpO2 Height Weight  04/27/23 0515 132/76 -- -- 65 -- 99 % -- --  04/27/23 0510 -- -- -- -- -- 95 % -- --  04/27/23 0505 -- -- -- -- -- 95 % -- --  04/27/23 0500 (!)  146/83 -- -- 74 -- 95 % -- --  04/27/23 0455 -- -- -- -- -- 95 % -- --  04/27/23 0450 -- -- -- -- -- 95 % -- --  04/27/23 0445 130/76 -- -- (!) 56 -- 95 % -- --  04/27/23 0440 -- -- -- -- -- 95 % -- --  04/27/23 0435 -- -- -- -- -- 95 % -- --  04/27/23 0430 132/76 -- -- 60 -- 96 % -- --  04/27/23 0425 -- -- -- -- -- 94 % -- --  04/27/23 0420 -- -- -- -- -- 95 % -- --  04/27/23 0415 -- -- -- -- -- 96 % -- --  04/27/23 0410 -- -- -- -- -- 95 % -- --  04/27/23 0405 -- -- -- -- -- 98 % -- --  04/27/23 0400 -- -- -- -- -- 96 % -- --  04/27/23 0355 -- -- -- -- -- 97 % -- --  04/27/23 0351 129/80 98.1 F (36.7 C) Oral 69 19 96 % -- --  04/27/23 0350 129/80 -- -- 69 -- 97 % -- --  04/27/23 0345 -- -- -- -- -- 96 % -- --  04/27/23 0340 -- -- -- -- -- 97 % -- --  04/27/23 0335 -- -- -- -- -- 96 % -- --  04/27/23 0330 -- -- -- -- -- 96 % -- --  04/27/23 0325 -- -- -- -- -- 95 % -- --  04/27/23 0320 118/71 98.6 F (37 C) Oral 66 20 98 % -- --  04/27/23 0315 -- -- -- -- -- 95 % -- --  04/27/23 0310 -- -- -- -- -- 98 % -- --  04/27/23 0305 -- -- -- -- -- 94 % -- --  04/27/23 0300 -- -- -- -- -- 96 % -- --  04/27/23 0255 -- -- -- -- -- 96 % -- --  04/27/23 0250 -- -- -- -- -- 97 % -- --  04/27/23 0245 -- -- -- -- -- 97 % -- --  04/27/23 0240 -- -- -- -- -- 97 % -- --  04/27/23 0235 -- -- -- -- -- 96 % -- --  04/27/23 0225 -- -- -- -- -- 97 % -- --  04/27/23 0215 131/77 -- -- 67 -- -- -- --  04/27/23 0200 (!) 138/93 -- -- 65 -- -- -- --  04/27/23 0145 132/80 -- -- 61 -- -- -- --  04/27/23 0130 132/87 -- -- 78 -- -- -- --  04/27/23 0110 -- -- -- -- --  97 % -- --  04/27/23 0105 -- -- -- -- -- 94 % -- --  04/27/23 0100 139/86 -- -- 63 -- 98 % -- --  04/27/23 0055 -- -- -- -- -- 98 % -- --  04/27/23 0050 -- -- -- -- -- 98 % -- --  04/27/23 0045 133/64 98.1 F (36.7 C) Oral 73 17 99 % -- --  04/27/23 0040 -- -- -- -- -- 97 % -- --  04/27/23 0035 -- -- -- -- -- 96 % -- --  04/27/23 0030 (!)  170/92 -- -- 61 -- 96 % -- --  04/27/23 0025 -- -- -- -- -- 96 % -- --  04/27/23 0020 -- -- -- -- -- 96 % -- --  04/27/23 0015 (!) 149/109 -- -- 70 -- 97 % -- --  04/27/23 0010 -- -- -- -- -- 98 % -- --  04/27/23 0005 -- -- -- -- -- 98 % -- --  04/27/23 0000 (!) 145/89 -- -- 60 -- 98 % -- --  04/26/23 2350 -- -- -- -- -- 97 % -- --  04/26/23 2345 (!) 150/97 -- -- 73 -- 98 % -- --  04/26/23 2340 -- -- -- -- -- 99 % -- --  04/26/23 2330 (!) 152/97 -- -- 70 -- 98 % -- --  04/26/23 2320 -- -- -- -- -- 98 % -- --  04/26/23 2315 (!) 147/94 -- -- 76 -- 98 % -- --  04/26/23 2312 (!) 145/94 -- -- 76 17 -- -- --  04/26/23 2310 (!) 145/94 -- -- 80 -- -- -- --  04/26/23 2247 (!) 143/89 98.3 F (36.8 C) Oral 78 16 -- 5\' 6"  (1.676 m) 75.9 kg    Physical Exam Vitals and nursing note reviewed.  Constitutional:      General: She is not in acute distress.    Appearance: She is well-developed.  HENT:     Head: Normocephalic.  Eyes:     Pupils: Pupils are equal, round, and reactive to light.  Cardiovascular:     Rate and Rhythm: Normal rate and regular rhythm.     Heart sounds: Normal heart sounds.  Pulmonary:     Effort: Pulmonary effort is normal. No respiratory distress.     Breath sounds: Normal breath sounds.  Abdominal:     General: Bowel sounds are normal. There is no distension.     Palpations: Abdomen is soft.     Tenderness: There is no abdominal tenderness.    Skin:    General: Skin is warm and dry.  Neurological:     Mental Status: She is alert and oriented to person, place, and time.  Psychiatric:        Mood and Affect: Mood normal.        Behavior: Behavior normal.        Thought Content: Thought content normal.        Judgment: Judgment normal.      MAU Course  Procedures  Results for orders placed or performed during the hospital encounter of 04/26/23 (from the past 24 hour(s))  CBC     Status: Abnormal   Collection Time: 04/26/23 11:31 PM  Result Value Ref  Range   WBC 10.5 4.0 - 10.5 K/uL   RBC 2.83 (L) 3.87 - 5.11 MIL/uL   Hemoglobin 6.7 (LL) 12.0 - 15.0 g/dL   HCT 41.3 (L) 24.4 - 01.0 %   MCV 79.5 (L) 80.0 - 100.0 fL  MCH 23.7 (L) 26.0 - 34.0 pg   MCHC 29.8 (L) 30.0 - 36.0 g/dL   RDW 16.1 (H) 09.6 - 04.5 %   Platelets 272 150 - 400 K/uL   nRBC 1.5 (H) 0.0 - 0.2 %  Comprehensive metabolic panel     Status: Abnormal   Collection Time: 04/26/23 11:31 PM  Result Value Ref Range   Sodium 137 135 - 145 mmol/L   Potassium 3.4 (L) 3.5 - 5.1 mmol/L   Chloride 106 98 - 111 mmol/L   CO2 22 22 - 32 mmol/L   Glucose, Bld 89 70 - 99 mg/dL   BUN 7 6 - 20 mg/dL   Creatinine, Ser 4.09 0.44 - 1.00 mg/dL   Calcium 7.9 (L) 8.9 - 10.3 mg/dL   Total Protein 5.1 (L) 6.5 - 8.1 g/dL   Albumin 2.2 (L) 3.5 - 5.0 g/dL   AST 27 15 - 41 U/L   ALT 18 0 - 44 U/L   Alkaline Phosphatase 114 38 - 126 U/L   Total Bilirubin 0.5 0.3 - 1.2 mg/dL   GFR, Estimated >81 >19 mL/min   Anion gap 9 5 - 15    MDM Labs ordered and reviewed.   CBC, CMP  CNM consulted with Dr. Berton Lan regarding presentation and results- MD recommends patient stay for blood transfusion.   Given high census on [facility], patient will receive transfusion in MAU  Procardia 30XL given in MAU  Patient doing well s/p 1 unit. MD states patient does not need to stay for post transfusion CBC. Ok to discharge home with procardia BID  Assessment and Plan   1. Symptomatic anemia   2. Postpartum state     -Discharge home in stable condition -Postpartum precautions discussed -Patient advised to follow-up with OB as scheduled for postpartum care -Patient may return to MAU as needed or if her condition were to change or worsen  Rolm Bookbinder, CNM 04/27/2023, 11:13 PM

## 2023-04-27 DIAGNOSIS — D649 Anemia, unspecified: Secondary | ICD-10-CM | POA: Diagnosis not present

## 2023-04-27 DIAGNOSIS — R531 Weakness: Secondary | ICD-10-CM

## 2023-04-27 LAB — COMPREHENSIVE METABOLIC PANEL
ALT: 18 U/L (ref 0–44)
AST: 27 U/L (ref 15–41)
Albumin: 2.2 g/dL — ABNORMAL LOW (ref 3.5–5.0)
Alkaline Phosphatase: 114 U/L (ref 38–126)
Anion gap: 9 (ref 5–15)
BUN: 7 mg/dL (ref 6–20)
CO2: 22 mmol/L (ref 22–32)
Calcium: 7.9 mg/dL — ABNORMAL LOW (ref 8.9–10.3)
Chloride: 106 mmol/L (ref 98–111)
Creatinine, Ser: 0.87 mg/dL (ref 0.44–1.00)
GFR, Estimated: >60 mL/min (ref >60)
Glucose, Bld: 89 mg/dL (ref 70–99)
Potassium: 3.4 mmol/L — ABNORMAL LOW (ref 3.5–5.1)
Sodium: 137 mmol/L (ref 135–145)
Total Bilirubin: 0.5 mg/dL (ref 0.3–1.2)
Total Protein: 5.1 g/dL — ABNORMAL LOW (ref 6.5–8.1)

## 2023-04-27 LAB — CBC
HCT: 22.5 % — ABNORMAL LOW (ref 36.0–46.0)
Hemoglobin: 6.7 g/dL — CL (ref 12.0–15.0)
MCH: 23.7 pg — ABNORMAL LOW (ref 26.0–34.0)
MCHC: 29.8 g/dL — ABNORMAL LOW (ref 30.0–36.0)
MCV: 79.5 fL — ABNORMAL LOW (ref 80.0–100.0)
Platelets: 272 10*3/uL (ref 150–400)
RBC: 2.83 MIL/uL — ABNORMAL LOW (ref 3.87–5.11)
RDW: 17.8 % — ABNORMAL HIGH (ref 11.5–15.5)
WBC: 10.5 10*3/uL (ref 4.0–10.5)
nRBC: 1.5 % — ABNORMAL HIGH (ref 0.0–0.2)

## 2023-04-27 LAB — TYPE AND SCREEN
Antibody Screen: NEGATIVE
Unit division: 0

## 2023-04-27 LAB — PREPARE RBC (CROSSMATCH)

## 2023-04-27 MED ORDER — SODIUM CHLORIDE 0.9% IV SOLUTION: Freq: Once | INTRAVENOUS | Status: AC

## 2023-04-27 MED ORDER — NIFEDIPINE ER 30 MG PO TB24: 30.0000 mg | ORAL_TABLET | Freq: Two times a day (BID) | ORAL | 0 refills | Status: DC

## 2023-04-27 MED ORDER — NIFEDIPINE ER OSMOTIC RELEASE 30 MG PO TB24
30.0000 mg | ORAL_TABLET | Freq: Once | ORAL | Status: AC
  Administered 2023-04-27: 30 mg via ORAL
  Filled 2023-04-27: qty 1

## 2023-04-27 NOTE — MAU Note (Signed)
CRITICAL VALUE STICKER  CRITICAL VALUE: Hgb 6.7   RECEIVER (on-site recipient of call): Henrine Screws   DATE & TIME NOTIFIED: 04/27/2023 1224  MESSENGER (representative from lab): Mingo Amber   MD NOTIFIED:  Ma Hillock CNM   TIME OF NOTIFICATION:  1224  RESPONSE: no new orders at this time

## 2023-04-28 LAB — TYPE AND SCREEN: ABO/RH(D): O POS

## 2023-04-28 LAB — BPAM RBC
Blood Product Expiration Date: 202406092359
ISSUE DATE / TIME: 202405110323
Unit Type and Rh: 5100

## 2023-04-30 ENCOUNTER — Ambulatory Visit: Payer: Medicaid Other

## 2023-04-30 ENCOUNTER — Inpatient Hospital Stay (HOSPITAL_COMMUNITY): Payer: Medicaid Other

## 2023-04-30 ENCOUNTER — Inpatient Hospital Stay (HOSPITAL_COMMUNITY)
Admission: AD | Admit: 2023-04-30 | Discharge: 2023-04-30 | Disposition: A | Discharge status: Home / Self Care | Payer: Medicaid Other | Attending: Obstetrics & Gynecology | Admitting: Obstetrics & Gynecology

## 2023-04-30 DIAGNOSIS — K5900 Constipation, unspecified: Secondary | ICD-10-CM | POA: Insufficient documentation

## 2023-04-30 DIAGNOSIS — R1084 Generalized abdominal pain: Secondary | ICD-10-CM | POA: Diagnosis present

## 2023-04-30 DIAGNOSIS — O9963 Diseases of the digestive system complicating the puerperium: Secondary | ICD-10-CM | POA: Diagnosis not present

## 2023-04-30 LAB — COMPREHENSIVE METABOLIC PANEL
ALT: 13 U/L (ref 0–44)
AST: 17 U/L (ref 15–41)
Albumin: 2.8 g/dL — ABNORMAL LOW (ref 3.5–5.0)
Alkaline Phosphatase: 115 U/L (ref 38–126)
Anion gap: 9 (ref 5–15)
BUN: 7 mg/dL (ref 6–20)
CO2: 23 mmol/L (ref 22–32)
Calcium: 8.7 mg/dL — ABNORMAL LOW (ref 8.9–10.3)
Chloride: 106 mmol/L (ref 98–111)
Creatinine, Ser: 0.78 mg/dL (ref 0.44–1.00)
GFR, Estimated: >60 mL/min (ref >60)
Glucose, Bld: 102 mg/dL — ABNORMAL HIGH (ref 70–99)
Potassium: 3.8 mmol/L (ref 3.5–5.1)
Sodium: 138 mmol/L (ref 135–145)
Total Bilirubin: 0.7 mg/dL (ref 0.3–1.2)
Total Protein: 5.9 g/dL — ABNORMAL LOW (ref 6.5–8.1)

## 2023-04-30 LAB — CBC
HCT: 30.9 % — ABNORMAL LOW (ref 36.0–46.0)
Hemoglobin: 9.3 g/dL — ABNORMAL LOW (ref 12.0–15.0)
MCH: 24.7 pg — ABNORMAL LOW (ref 26.0–34.0)
MCHC: 30.1 g/dL (ref 30.0–36.0)
MCV: 82 fL (ref 80.0–100.0)
Platelets: 305 10*3/uL (ref 150–400)
RBC: 3.77 MIL/uL — ABNORMAL LOW (ref 3.87–5.11)
RDW: 20.4 % — ABNORMAL HIGH (ref 11.5–15.5)
WBC: 8.5 10*3/uL (ref 4.0–10.5)
nRBC: 0 % (ref 0.0–0.2)

## 2023-04-30 MED ORDER — NIFEDIPINE ER OSMOTIC RELEASE 30 MG PO TB24
30.0000 mg | ORAL_TABLET | Freq: Once | ORAL | Status: AC
  Administered 2023-04-30: 30 mg via ORAL
  Filled 2023-04-30: qty 1

## 2023-04-30 MED ORDER — SORBITOL 70 % SOLN
960.0000 mL | TOPICAL_OIL | Freq: Once | ORAL | Status: AC
  Administered 2023-04-30: 960 mL via RECTAL
  Filled 2023-04-30: qty 240

## 2023-04-30 NOTE — MAU Note (Signed)
Sonya [name] is a 22 y.o. at Unknown here in MAU reporting: vag del on the 6th.  Was here a couple days ago and got a blood transfusion.  Was constipated then, they offered an enema, she declined it then, now is here for that.   Tried stool softener, Lynzess, and something else, went a little, but still feels backed up, is ready for the enema.   Onset of complaint: hasn't really had a decent BM since dc'd after delivery Pain score: abd cramping 10 Vitals:   04/30/23 1324  BP: (!) 149/95  Pulse: 77  Resp: 16  Temp: 98 F (36.7 C)  SpO2: 99%     Lab orders placed from triage:   Has not taken BP medicaton today

## 2023-04-30 NOTE — MAU Provider Note (Signed)
History     CSN: [id number]  Arrival date and time: 04/30/23 1242   Event Date/Time   First Provider Initiated Contact with Patient 04/30/23 1355      Chief Complaint  Patient presents with   Abdominal Pain   Constipation   Sonya [name] is a 22 y.o. G1P0102 who is PP from NSVD. She had pre-eclampsia during the pregnancy and was DC home on procardia and lasix. She states that she took all of her meds from DC, but does not specifically recall the lasix. She reports that she has been taking the procardia. She last took it yesterday morning and she did not take the procardia this morning.   Abdominal Pain This is a new problem. The current episode started in the past 7 days. The problem occurs intermittently. The problem is unchanged. The pain is located in the generalized abdominal region. The pain is mild. Associated symptoms include constipation.  Constipation This is a new problem. The current episode started in the past 7 days. The problem is unchanged. Her stool frequency is 1 time per week or less (last BM was 04/23/2023). Associated symptoms include abdominal pain. Risk factors include immobility and dietary change. Treatments tried: Linzess. The treatment provided no relief.    OB History     Gravida  1   Para  1   Term  0   Preterm  1   AB  0   Living  2      SAB  0   IAB  0   Ectopic  0   Multiple  1   Live Births  2           Past Medical History:  Diagnosis Date   ADHD (attention deficit hyperactivity disorder)    no meds currently   Anxiety    Cholelithiasis 12/30/2017   Depression    History of seasonal allergies    Pregnancy induced hypertension     Past Surgical History:  Procedure Laterality Date   CHOLECYSTECTOMY N/A 01/29/2018   Procedure: LAPAROSCOPIC CHOLECYSTECTOMY WITH INTRAOPERATIVE CHOLANGIOGRAM;  Surgeon: Kandice Hams, MD;  Location: [facility] OR;  Service: Pediatrics;  Laterality: N/A;    Family History  Problem Relation  Age of Onset   Epilepsy Mother    Schizophrenia Father    Heart disease Brother    Asthma Brother    Epilepsy Brother    Hypertension Maternal Grandfather    Diabetes Maternal Grandfather    Cancer Paternal Grandfather     Social History   Tobacco Use   Smoking status: Never   Smokeless tobacco: Never  Vaping Use   Vaping Use: Never used  Substance Use Topics   Alcohol use: Not Currently   Drug use: Not Currently    Types: Marijuana    Comment: last smoked 2 weeks ago (early October 2023)    Allergies: No Known Allergies  Medications Prior to Admission  Medication Sig Dispense Refill Last Dose   benzocaine-Menthol (DERMOPLAST) 20-0.5 % AERO Apply 1 Application topically as needed for irritation (perineal discomfort). 78 g 0 04/29/2023   ferrous sulfate 325 (65 FE) MG tablet Take 1 tablet (325 mg total) by mouth every other day. 30 tablet 3 Past Week   ibuprofen (ADVIL) 600 MG tablet Take 1 tablet (600 mg total) by mouth every 6 (six) hours. 30 tablet 0 Past Week   NIFEdipine (ADALAT CC) 30 MG 24 hr tablet Take 1 tablet (30 mg total) by mouth 2 (two) times daily.  60 tablet 0 Past Week   senna-docusate (SENOKOT-S) 8.6-50 MG tablet Take 2 tablets by mouth daily. 30 tablet 0 Past Week   acetaminophen (TYLENOL) 325 MG tablet Take 2 tablets (650 mg total) by mouth every 4 (four) hours as needed (for pain scale < 4). 30 tablet 0    furosemide (LASIX) 20 MG tablet Take 1 tablet (20 mg total) by mouth daily for 4 days. 4 tablet 0     Review of Systems  Gastrointestinal:  Positive for abdominal pain and constipation.  All other systems reviewed and are negative.  Physical Exam   Blood pressure (!) 150/86, pulse (!) 59, temperature 98 F (36.7 C), temperature source Oral, resp. rate 16, height 5\' 6"  (1.676 m), weight 68.3 kg, SpO2 99 %, currently breastfeeding.  Physical Exam Vitals and nursing note reviewed. Exam conducted with a chaperone present.  Constitutional:       General: She is not in acute distress. HENT:     Head: Normocephalic.  Eyes:     Pupils: Pupils are equal, round, and reactive to light.  Cardiovascular:     Rate and Rhythm: Normal rate.  Pulmonary:     Effort: Pulmonary effort is normal.  Abdominal:     Palpations: Abdomen is soft.     Tenderness: There is no abdominal tenderness.  Skin:    General: Skin is warm and dry.  Neurological:     Mental Status: She is alert and oriented to person, place, and time.  Psychiatric:        Mood and Affect: Mood normal.        Behavior: Behavior normal.    Patient Vitals for the past 24 hrs:  BP Temp Temp src Pulse Resp SpO2 Height Weight  04/30/23 1601 (!) 150/86 -- -- (!) 59 -- -- -- --  04/30/23 1543 (!) 146/80 -- -- 65 -- -- -- --  04/30/23 1531 (!) 142/79 -- -- (!) 57 -- -- -- --  04/30/23 1501 139/85 -- -- 96 -- -- -- --  04/30/23 1418 (!) 156/94 -- -- 78 -- -- -- --  04/30/23 1333 (!) 144/100 -- -- 82 -- -- -- --  04/30/23 1324 (!) 149/95 98 F (36.7 C) Oral 77 16 99 % 5\' 6"  (1.676 m) 68.3 kg    Results for orders placed or performed during the hospital encounter of 04/30/23 (from the past 24 hour(s))  CBC     Status: Abnormal   Collection Time: 04/30/23  1:42 PM  Result Value Ref Range   WBC 8.5 4.0 - 10.5 K/uL   RBC 3.77 (L) 3.87 - 5.11 MIL/uL   Hemoglobin 9.3 (L) 12.0 - 15.0 g/dL   HCT 82.9 (L) 56.2 - 13.0 %   MCV 82.0 80.0 - 100.0 fL   MCH 24.7 (L) 26.0 - 34.0 pg   MCHC 30.1 30.0 - 36.0 g/dL   RDW 86.5 (H) 78.4 - 69.6 %   Platelets 305 150 - 400 K/uL   nRBC 0.0 0.0 - 0.2 %  Comprehensive metabolic panel     Status: Abnormal   Collection Time: 04/30/23  1:42 PM  Result Value Ref Range   Sodium 138 135 - 145 mmol/L   Potassium 3.8 3.5 - 5.1 mmol/L   Chloride 106 98 - 111 mmol/L   CO2 23 22 - 32 mmol/L   Glucose, Bld 102 (H) 70 - 99 mg/dL   BUN 7 6 - 20 mg/dL   Creatinine, Ser 2.95 0.44 -  1.00 mg/dL   Calcium 8.7 (L) 8.9 - 10.3 mg/dL   Total Protein 5.9 (L) 6.5  - 8.1 g/dL   Albumin 2.8 (L) 3.5 - 5.0 g/dL   AST 17 15 - 41 U/L   ALT 13 0 - 44 U/L   Alkaline Phosphatase 115 38 - 126 U/L   Total Bilirubin 0.7 0.3 - 1.2 mg/dL   GFR, Estimated >16 >10 mL/min   Anion gap 9 5 - 15     MAU Course  Procedures  MDM Patient has had SMOG enema with good results results  Labs are normal today, and patient was given dose of procardia that she missed this morning    Assessment and Plan   1. Constipation, unspecified constipation type   2. Postpartum state    DC home in stable condition  Pre-eclampsia warning signs reviewed  RX: no new RX, but patient advised to take procardia as directed  Return to MAU as needed FU with OB as planned   Follow-up Information     [facility] Follow up.   Specialty: Obstetrics and Gynecology Why: As scheduled Contact information: [address] [place] [address] [phone]               Thressa Sheller DNP, CNM  04/30/23  4:27 PM

## 2023-05-01 ENCOUNTER — Ambulatory Visit (HOSPITAL_COMMUNITY): Payer: Self-pay

## 2023-05-01 NOTE — Lactation Note (Signed)
This note was copied from a baby's chart.  NICU Lactation Consultation Note  Patient Name: Sonya [name] [id number] Date: 05/01/2023 Age:22 years  Reason for consult: Follow-up assessment; NICU baby; Multiple gestation; Early term 23-38.6wks; Infant < 6lbs; Primapara; 1st time breastfeeding  SUBJECTIVE  LC accompanied Sonya [name] to observe Baby B "Sonya [name]" having a swallow study due to history of difficultly with oral feedings.  Suspect some regurgitation during feedings.  [name] spoke with Mom after the test.  [facility] asked Mom how she was doing.  Mom responded quietly that she was tired.  She is pumping about 3 times per day, expressing 120 ml each time.  Asked Mom if she would like assistance with breastfeeding "Sonya [name]".  We talked about the benefits of breast milk and breast milk flow from the breast with babies that are having difficult with SSB.  Mom was interested.  Talked about having the nurses feed the babies overnight so Mom can regularly pump and rest. Lactation to attend the 3pm feeding tomorrow to assist with positioning and latching.   OBJECTIVE Infant data: No data recorded Infant feeding assessment Scale for Readiness: 2 (MBS) Scale for Quality: 5   Maternal data: G1P0102  Vaginal, Vacuum (Extractor) Pumping frequency: 3 times a day Pumped volume: 120 mL  WIC Program: No WIC Referral Sent?: Yes Pump: Personal (MomCozy, wearable)  ASSESSMENT Infant: Feeding Status: Scheduled 9-12-3-6  Maternal: Milk volume: Low  INTERVENTIONS/PLAN Interventions: Interventions: Skin to skin; Breast massage; Hand express; DEBP; Education Tools: Pump; Hands-free pumping top Pump Education: Milk Storage; Setup, frequency, and cleaning  Plan: Consult Status: NICU follow-up NICU Follow-up type: Assist with IDF-2 (Mother does not need to pre-pump before breastfeeding)   Sonya [name] 05/01/2023, 4:45 PM

## 2023-05-02 ENCOUNTER — Telehealth (HOSPITAL_COMMUNITY): Payer: Self-pay | Admitting: *Deleted

## 2023-05-02 NOTE — Telephone Encounter (Signed)
Left phone voicemail message.  Duffy Rhody, RN 05-02-2023 at 11:00am

## 2023-05-07 ENCOUNTER — Ambulatory Visit: Payer: Medicaid Other

## 2023-06-05 ENCOUNTER — Ambulatory Visit: Payer: Medicaid Other | Admitting: Obstetrics and Gynecology

## 2023-06-06 ENCOUNTER — Encounter: Payer: Self-pay | Admitting: Obstetrics

## 2023-06-06 ENCOUNTER — Ambulatory Visit (INDEPENDENT_AMBULATORY_CARE_PROVIDER_SITE_OTHER): Payer: Medicaid Other | Admitting: Obstetrics

## 2023-06-06 DIAGNOSIS — Z3009 Encounter for other general counseling and advice on contraception: Secondary | ICD-10-CM

## 2023-06-06 DIAGNOSIS — Z30016 Encounter for initial prescription of transdermal patch hormonal contraceptive device: Secondary | ICD-10-CM | POA: Diagnosis not present

## 2023-06-06 MED ORDER — XULANE 150-35 MCG/24HR TD PTWK: 1.0000 | MEDICATED_PATCH | TRANSDERMAL | 12 refills | Status: DC

## 2023-06-06 NOTE — Progress Notes (Signed)
Post Partum Visit Note  Sonya [name] is a 22 y.o. G67P0102 female who presents for a postpartum visit. She is 6 weeks postpartum following a normal spontaneous vaginal delivery and vacuum-assisted vaginal delivery.  I have fully reviewed the prenatal and intrapartum course. The delivery was at 35 gestational weeks.  Anesthesia: epidural. Postpartum course has been good. Baby is doing well. Baby is feeding by bottle - Enfamil with Iron and Similac with Iron. Bleeding no bleeding. Bowel function is normal. Bladder function is normal. Patient is not sexually active. Contraception method is Ship broker weekly. Postpartum depression screening: negative.   The pregnancy intention screening data noted above was reviewed. Potential methods of contraception were discussed. The patient elected to proceed with No data recorded.   Edinburgh Postnatal Depression Scale - 06/06/23 0821       Edinburgh Postnatal Depression Scale:  In the Past 7 Days   I have been able to laugh and see the funny side of things. 0    I have looked forward with enjoyment to things. 0    I have blamed myself unnecessarily when things went wrong. 0    I have been anxious or worried for no good reason. 0    I have felt scared or panicky for no good reason. 0    Things have been getting on top of me. 0    I have been so unhappy that I have had difficulty sleeping. 0    I have felt sad or miserable. 0    I have been so unhappy that I have been crying. 0    The thought of harming myself has occurred to me. 0    Edinburgh Postnatal Depression Scale Total 0             Health Maintenance Due  Topic Date Due   COVID-19 Vaccine (1) Never done   HPV VACCINES (1 - 2-dose series) Never done   DTaP/Tdap/Td (1 - Tdap) Never done    The following portions of the patient's history were reviewed and updated as appropriate: allergies, current medications, past family history, past medical history, past social history,  past surgical history, and problem list.  Review of Systems A comprehensive review of systems was negative.  Objective:  BP 118/60   Pulse 72   Ht 5\' 6"  (1.676 m)   Wt 142 lb 12.8 oz (64.8 kg)   BMI 23.05 kg/m    General:  alert and no distress   Breasts:  normal  Lungs: clear to auscultation bilaterally  Heart:  regular rate and rhythm, S1, S2 normal, no murmur, click, rub or gallop  Abdomen: soft, non-tender; bowel sounds normal; no masses,  no organomegaly   Wound none  GU exam:  not indicated       Assessment:   1. Postpartum care following vaginal delivery - doing well  2. Encounter for other general counseling or advice on contraception - options discussed - wants the Xulane Transdermal Patch  3. Encounter for initial prescription of transdermal patch hormonal contraceptive device Rx: - XULANE 150-35 MCG/24HR transdermal patch; Place 1 patch onto the skin once a week.  Dispense: 3 patch; Refill: 12    Plan:   Essential components of care per ACOG recommendations:  1.  Mood and well being: Patient with negative depression screening today. Reviewed local resources for support.  - Patient tobacco use? No.   - hx of drug use? No.    2. Infant care and feeding:  -  Patient currently breastmilk feeding? No.  -Social determinants of health (SDOH) reviewed in EPIC. No concerns.  3. Sexuality, contraception and birth spacing - Patient does not want a pregnancy in the next year.  Desired family size is 2 children.  - Reviewed reproductive life planning. Reviewed contraceptive methods based on pt preferences and effectiveness.  Patient desired Contraceptive Patch today.   - Discussed birth spacing of 18 months  4. Sleep and fatigue -Encouraged family/partner/community support of 4 hrs of uninterrupted sleep to help with mood and fatigue  5. Physical Recovery  - Discussed patients delivery and complications. She describes her labor as good. - Patient had a Vaginal, no  problems at delivery. Patient had a 2nd degree laceration. Perineal healing reviewed. Patient expressed understanding - Patient has urinary incontinence? No. - Patient is safe to resume physical and sexual activity  6.  Health Maintenance - HM due items addressed Yes - Last pap smear No results found for: "DIAGPAP" Pap smear not done at today's visit.  -Breast Cancer screening indicated? No.   7. Chronic Disease/Pregnancy Condition follow up: None   Coral Ceo, MD [facility] for [facility], [facility], [place] 06/06/23

## 2023-07-04 ENCOUNTER — Ambulatory Visit: Payer: Medicaid Other | Admitting: Obstetrics & Gynecology

## 2024-09-01 ENCOUNTER — Other Ambulatory Visit: Payer: Self-pay

## 2024-09-01 ENCOUNTER — Encounter (HOSPITAL_COMMUNITY): Payer: Self-pay

## 2024-09-01 ENCOUNTER — Emergency Department (HOSPITAL_COMMUNITY): Payer: Self-pay

## 2024-09-01 ENCOUNTER — Emergency Department (HOSPITAL_COMMUNITY)
Admission: EM | Admit: 2024-09-01 | Discharge: 2024-09-01 | Disposition: A | Discharge status: Home / Self Care | Payer: Self-pay | Attending: Emergency Medicine | Admitting: Emergency Medicine

## 2024-09-01 DIAGNOSIS — S161XXA Strain of muscle, fascia and tendon at neck level, initial encounter: Secondary | ICD-10-CM | POA: Insufficient documentation

## 2024-09-01 DIAGNOSIS — S0990XA Unspecified injury of head, initial encounter: Secondary | ICD-10-CM | POA: Insufficient documentation

## 2024-09-01 DIAGNOSIS — Y92410 Unspecified street and highway as the place of occurrence of the external cause: Secondary | ICD-10-CM | POA: Insufficient documentation

## 2024-09-01 DIAGNOSIS — M542 Cervicalgia: Secondary | ICD-10-CM | POA: Diagnosis not present

## 2024-09-01 DIAGNOSIS — S199XXA Unspecified injury of neck, initial encounter: Secondary | ICD-10-CM | POA: Diagnosis present

## 2024-09-01 LAB — HCG, SERUM, QUALITATIVE: Preg, Serum: NEGATIVE

## 2024-09-01 MED ORDER — LIDOCAINE 5 % EX PTCH: 1.0000 | MEDICATED_PATCH | CUTANEOUS | 0 refills | Status: DC

## 2024-09-01 MED ORDER — KETOROLAC TROMETHAMINE 60 MG/2ML IM SOLN
30.0000 mg | Freq: Once | INTRAMUSCULAR | Status: DC
  Filled 2024-09-01: qty 2

## 2024-09-01 MED ORDER — ACETAMINOPHEN 500 MG PO TABS
1000.0000 mg | ORAL_TABLET | Freq: Once | ORAL | Status: AC
  Administered 2024-09-01: 1000 mg via ORAL
  Filled 2024-09-01: qty 2

## 2024-09-01 MED ORDER — CYCLOBENZAPRINE HCL 10 MG PO TABS
5.0000 mg | ORAL_TABLET | Freq: Once | ORAL | Status: AC
  Administered 2024-09-01: 5 mg via ORAL
  Filled 2024-09-01: qty 1

## 2024-09-01 MED ORDER — CYCLOBENZAPRINE HCL 10 MG PO TABS: 10.0000 mg | ORAL_TABLET | Freq: Two times a day (BID) | ORAL | 0 refills | Status: DC | PRN

## 2024-09-01 MED ORDER — LIDOCAINE 5 % EX PTCH
1.0000 | MEDICATED_PATCH | CUTANEOUS | Status: DC
  Administered 2024-09-01: 1 via TRANSDERMAL
  Filled 2024-09-01: qty 1

## 2024-09-01 NOTE — ED Notes (Signed)
 Able to ambulate independently with steady gait.
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
## 2024-11-24 ENCOUNTER — Encounter (HOSPITAL_COMMUNITY): Payer: Self-pay | Admitting: *Deleted

## 2024-11-24 ENCOUNTER — Ambulatory Visit (HOSPITAL_COMMUNITY)
Admission: EM | Admit: 2024-11-24 | Discharge: 2024-11-24 | Disposition: A | Discharge status: Home / Self Care | Payer: Self-pay | Attending: Emergency Medicine | Admitting: Emergency Medicine

## 2024-11-24 DIAGNOSIS — J069 Acute upper respiratory infection, unspecified: Secondary | ICD-10-CM

## 2024-11-24 DIAGNOSIS — R0981 Nasal congestion: Secondary | ICD-10-CM

## 2024-11-24 MED ORDER — PROMETHAZINE-DM 6.25-15 MG/5ML PO SYRP: 5.0000 mL | ORAL_SOLUTION | Freq: Four times a day (QID) | ORAL | 0 refills | Status: DC | PRN

## 2024-11-24 MED ORDER — FLUTICASONE PROPIONATE 50 MCG/ACT NA SUSP: 1.0000 | Freq: Every day | NASAL | 0 refills | Status: DC

## 2024-11-24 NOTE — Discharge Instructions (Addendum)
 Your symptoms are consistent with a viral illness.  You can alternate between 500 mg of Tylenol  and 800 mg of ibuprofen  every 4-6 hours to help with fever, body aches or chills.  The cough syrup will suppress your cough, it may cause drowsiness so do not drink alcohol or drive on it.  You can use a nasal spray to help with your congestion.  Viral illnesses typically last 5 to 7 days.  Deek follow-up care for prolonged symptoms or any new concerning symptoms.

## 2024-11-24 NOTE — ED Provider Notes (Signed)
 [facility]    CSN: [id number] Arrival date & time: 11/24/24  1112      History   Chief Complaint Chief Complaint  Patient presents with   Cough   Nasal Congestion   Facial Pain   Letter for School/Work    HPI Sonya [name] is a 23 y.o. female.   Patient presents to clinic over concern of sinus pressure, nasal congestion, cough, chest pain with coughing and feeling unwell for the past 4 days.  Has taken Alka-Seltzer plus at home.  Has not had wheezing or shortness of breath, does endorse trouble breathing out of her nose due to congestion.  Does not think she has had fevers, has not had bodyaches.  The history is provided by the patient and medical records.  Cough   Past Medical History:  Diagnosis Date   ADHD (attention deficit hyperactivity disorder)    no meds currently   Anxiety    Cholelithiasis 12/30/2017   Depression    History of seasonal allergies    Pregnancy induced hypertension     Patient Active Problem List   Diagnosis Date Noted   First degree perineal laceration during delivery, delivered 04/22/2023   Periurethral trauma during delivery 04/22/2023   Vaginal delivery 04/21/2023   Mild pre-eclampsia 04/21/2023   Anemia 04/21/2023   Twin pregnancy, twins dichorionic and diamniotic 12/20/2022   Supervision of other normal pregnancy, antepartum 10/18/2022   MDD (major depressive disorder), recurrent episode, severe (HCC) 10/20/2020    Past Surgical History:  Procedure Laterality Date   CHOLECYSTECTOMY N/A 01/29/2018   Procedure: LAPAROSCOPIC CHOLECYSTECTOMY WITH INTRAOPERATIVE CHOLANGIOGRAM;  Surgeon: Chuckie Casimiro KIDD, MD;  Location: [facility] OR;  Service: Pediatrics;  Laterality: N/A;    OB History     Gravida  1   Para  1   Term  0   Preterm  1   AB  0   Living  2      SAB  0   IAB  0   Ectopic  0   Multiple  1   Live Births  2            Home Medications    Prior to Admission medications   Medication Sig  Start Date End Date Taking? Authorizing Provider  fluticasone  (FLONASE ) 50 MCG/ACT nasal spray Place 1 spray into both nostrils daily. 11/24/24  Yes Amorette Charrette  N, FNP  promethazine -dextromethorphan (PROMETHAZINE -DM) 6.25-15 MG/5ML syrup Take 5 mLs by mouth 4 (four) times daily as needed for cough. 11/24/24  Yes Bronsyn Shappell  N, FNP  acetaminophen  (TYLENOL ) 325 MG tablet Take 2 tablets (650 mg total) by mouth every 4 (four) hours as needed (for pain scale < 4). Patient not taking: Reported on 06/06/2023 04/23/23   Richie Harlene RODES, DO  benzocaine -Menthol  (DERMOPLAST) 20-0.5 % AERO Apply 1 Application topically as needed for irritation (perineal discomfort). Patient not taking: Reported on 06/06/2023 04/23/23   Richie Harlene RODES, DO  cyclobenzaprine  (FLEXERIL ) 10 MG tablet Take 1 tablet (10 mg total) by mouth 2 (two) times daily as needed for muscle spasms. 09/01/24   Jerrol Agent, MD  ferrous sulfate  325 (65 FE) MG tablet Take 1 tablet (325 mg total) by mouth every other day. Patient not taking: Reported on 06/06/2023 04/25/23   Richie Harlene RODES, DO  furosemide  (LASIX ) 20 MG tablet Take 1 tablet (20 mg total) by mouth daily for 4 days. 04/24/23 04/28/23  Mercado-Ortiz, Harlene RODES, DO  ibuprofen  (ADVIL ) 600 MG tablet Take 1 tablet (  600 mg total) by mouth every 6 (six) hours. Patient not taking: Reported on 06/06/2023 04/24/23   Richie Raisin Q, DO  lidocaine  (LIDODERM ) 5 % Place 1 patch onto the skin daily. Remove & Discard patch within 12 hours or as directed by MD 09/01/24   Jerrol Agent, MD  NIFEdipine  (ADALAT  CC) 30 MG 24 hr tablet Take 1 tablet (30 mg total) by mouth 2 (two) times daily. Patient not taking: Reported on 06/06/2023 04/27/23   Marylen Aleck HERO, CNM  senna-docusate (SENOKOT-S) 8.6-50 MG tablet Take 2 tablets by mouth daily. Patient not taking: Reported on 06/06/2023 04/23/23   Richie Raisin RODES, DO  XULANE  150-35 MCG/24HR transdermal patch Place 1 patch  onto the skin once a week. 06/06/23   Rudy Carlin LABOR, MD    Family History Family History  Problem Relation Age of Onset   Epilepsy Mother    Schizophrenia Father    Heart disease Brother    Asthma Brother    Epilepsy Brother    Hypertension Maternal Grandfather    Diabetes Maternal Grandfather    Cancer Paternal Grandfather     Social History Social History   Tobacco Use   Smoking status: Never   Smokeless tobacco: Never  Vaping Use   Vaping status: Never Used  Substance Use Topics   Alcohol use: Not Currently   Drug use: Not Currently    Types: Marijuana    Comment: last smoked 2 weeks ago (early October 2023)     Allergies   Patient has no known allergies.   Review of Systems Review of Systems  Per HPI  Physical Exam Triage Vital Signs ED Triage Vitals  Encounter Vitals Group     BP 11/24/24 1317 112/72     Girls Systolic BP Percentile --      Girls Diastolic BP Percentile --      Boys Systolic BP Percentile --      Boys Diastolic BP Percentile --      Pulse Rate 11/24/24 1317 65     Resp 11/24/24 1317 16     Temp 11/24/24 1317 98.7 F (37.1 C)     Temp Source 11/24/24 1317 Oral     SpO2 11/24/24 1317 99 %     Weight --      Height --      Head Circumference --      Peak Flow --      Pain Score 11/24/24 1316 0     Pain Loc --      Pain Education --      Exclude from Growth Chart --    No data found.  Updated Vital Signs BP 112/72 (BP Location: Left Arm)   Pulse 65   Temp 98.7 F (37.1 C) (Oral)   Resp 16   LMP 10/30/2024 (Approximate)   SpO2 99%   Breastfeeding No   Visual Acuity Right Eye Distance:   Left Eye Distance:   Bilateral Distance:    Right Eye Near:   Left Eye Near:    Bilateral Near:     Physical Exam Vitals and nursing note reviewed.  Constitutional:      Appearance: Normal appearance.  HENT:     Head: Normocephalic and atraumatic.     Right Ear: External ear normal.     Left Ear: External ear normal.      Nose: Congestion and rhinorrhea present.     Mouth/Throat:     Mouth: Mucous membranes are moist.  Pharynx: Posterior oropharyngeal erythema present.  Eyes:     Conjunctiva/sclera: Conjunctivae normal.  Cardiovascular:     Rate and Rhythm: Normal rate and regular rhythm.     Heart sounds: Normal heart sounds. No murmur heard. Pulmonary:     Effort: Pulmonary effort is normal. No respiratory distress.     Breath sounds: Normal breath sounds. No wheezing.  Skin:    General: Skin is warm and dry.  Neurological:     General: No focal deficit present.     Mental Status: She is alert and oriented to person, place, and time.  Psychiatric:        Mood and Affect: Mood normal.        Behavior: Behavior normal.      UC Treatments / Results  Labs (all labs ordered are listed, but only abnormal results are displayed) Labs Reviewed - No data to display  EKG   Radiology No results found.  Procedures Procedures (including critical care time)  Medications Ordered in UC Medications - No data to display  Initial Impression / Assessment and Plan / UC Course  I have reviewed the triage vital signs and the nursing notes.  Pertinent labs & imaging results that were available during my care of the patient were reviewed by me and considered in my medical decision making (see chart for details).  Vitals and triage reviewed, patient is hemodynamically stable.  Lungs vesicular, heart with regular rate and rhythm.  Congestion, rhinorrhea and postnasal drip present on physical exam.  Afebrile without bodyaches, COVID and flu testing deferred.  Symptoms consistent with viral URI, symptomatic management discussed.  Plan of care, follow-up care and return precautions given, no questions at this time.  Work note provided.     Final Clinical Impressions(s) / UC Diagnoses   Final diagnoses:  Viral URI with cough  Nasal congestion     Discharge Instructions      Your symptoms are  consistent with a viral illness.  You can alternate between 500 mg of Tylenol  and 800 mg of ibuprofen  every 4-6 hours to help with fever, body aches or chills.  The cough syrup will suppress your cough, it may cause drowsiness so do not drink alcohol or drive on it.  You can use a nasal spray to help with your congestion.  Viral illnesses typically last 5 to 7 days.  Deek follow-up care for prolonged symptoms or any new concerning symptoms.    ED Prescriptions     Medication Sig Dispense Auth. Provider   fluticasone  (FLONASE ) 50 MCG/ACT nasal spray Place 1 spray into both nostrils daily. 9.9 mL Dreama, Skyanne Welle  N, FNP   promethazine -dextromethorphan (PROMETHAZINE -DM) 6.25-15 MG/5ML syrup Take 5 mLs by mouth 4 (four) times daily as needed for cough. 118 mL Dreama, Marcellas Marchant  N, FNP      PDMP not reviewed this encounter.   Dreama, Alim Cattell  N, FNP 11/24/24 1351

## 2024-11-24 NOTE — ED Triage Notes (Signed)
 Pt states she has sinus pressure, cough, congestion since Saturday. She is taking alkaseltzer plus

## 2024-11-30 ENCOUNTER — Other Ambulatory Visit: Payer: Self-pay

## 2024-11-30 ENCOUNTER — Encounter (HOSPITAL_COMMUNITY): Payer: Self-pay | Admitting: *Deleted

## 2024-11-30 ENCOUNTER — Ambulatory Visit (HOSPITAL_COMMUNITY)
Admission: EM | Admit: 2024-11-30 | Discharge: 2024-11-30 | Disposition: A | Discharge status: Home / Self Care | Payer: Self-pay | Attending: Emergency Medicine | Admitting: Emergency Medicine

## 2024-11-30 DIAGNOSIS — R52 Pain, unspecified: Secondary | ICD-10-CM

## 2024-11-30 DIAGNOSIS — B349 Viral infection, unspecified: Secondary | ICD-10-CM | POA: Insufficient documentation

## 2024-11-30 DIAGNOSIS — R112 Nausea with vomiting, unspecified: Secondary | ICD-10-CM | POA: Insufficient documentation

## 2024-11-30 DIAGNOSIS — M545 Low back pain, unspecified: Secondary | ICD-10-CM | POA: Insufficient documentation

## 2024-11-30 DIAGNOSIS — Z3202 Encounter for pregnancy test, result negative: Secondary | ICD-10-CM

## 2024-11-30 DIAGNOSIS — R350 Frequency of micturition: Secondary | ICD-10-CM

## 2024-11-30 LAB — POCT URINE DIPSTICK
Bilirubin, UA: NEGATIVE
Glucose, UA: NEGATIVE mg/dL
Leukocytes, UA: NEGATIVE
Nitrite, UA: NEGATIVE
POC PROTEIN,UA: >=300 — AB
Spec Grav, UA: 1.02 (ref 1.010–1.025)
Urobilinogen, UA: 0.2 U/dL
pH, UA: 6.5 (ref 5.0–8.0)

## 2024-11-30 LAB — POCT URINE PREGNANCY: Preg Test, Ur: NEGATIVE

## 2024-11-30 LAB — POC COVID19/FLU A&B COMBO
Covid Antigen, POC: NEGATIVE
Influenza A Antigen, POC: NEGATIVE
Influenza B Antigen, POC: NEGATIVE

## 2024-11-30 MED ORDER — IBUPROFEN 600 MG PO TABS: 600.0000 mg | ORAL_TABLET | Freq: Four times a day (QID) | ORAL | 0 refills | Status: AC | PRN

## 2024-11-30 MED ORDER — ONDANSETRON 4 MG PO TBDP
4.0000 mg | ORAL_TABLET | Freq: Once | ORAL | Status: AC
  Administered 2024-11-30: 10:00:00 4 mg via ORAL

## 2024-11-30 MED ORDER — ONDANSETRON 4 MG PO TBDP
ORAL_TABLET | ORAL | Status: AC
  Filled 2024-11-30: qty 1

## 2024-11-30 MED ORDER — ACETAMINOPHEN 500 MG PO TABS: 500.0000 mg | ORAL_TABLET | Freq: Four times a day (QID) | ORAL | 0 refills | Status: AC | PRN

## 2024-11-30 MED ORDER — ONDANSETRON 4 MG PO TBDP: 4.0000 mg | ORAL_TABLET | Freq: Three times a day (TID) | ORAL | 0 refills | Status: DC | PRN

## 2024-11-30 NOTE — ED Triage Notes (Signed)
/  C/O chills, significant body aches, significant mid low-back pain, slight rhinorrhea, slight cough, nausea, vomiting onset 2 days. States has been able to keep down some light PO fluids. Pt was seen 11/24/24 for URI; states returned to work yesterday. Has been taking promethazine -DM syrup and using fluticasone  nasal spray.

## 2024-11-30 NOTE — Discharge Instructions (Signed)
 COVID and flu testing were negative, you most likely have a different viral illness.  Use the nausea medicine every 8 hours as needed and follow a bland diet such as bananas, rice, toast and applesauce.  For body aches and chills you can alternate between Tylenol  and ibuprofen  every 4-6 hours.  Ensure you are staying well-hydrated getting plenty of rest.  Symptoms should improve over the next 5 to 7 days.  If no improvement or any changes seek follow-up care.

## 2024-11-30 NOTE — ED Provider Notes (Signed)
 [facility]    CSN: [id number] Arrival date & time: 11/30/24  0807      History   Chief Complaint Chief Complaint  Patient presents with   Generalized Body Aches    HPI Sonya [name] is a 23 y.o. female.   Patient presents to clinic over concern of generalized body aches, bilateral low back pain, abdominal discomfort and N/V since late last night. Patient had symptoms at work. She got home and went to bed, woke up in a full body swear. Took ibuprofen  around midnight. Did recently have a viral URI, reports her symptoms resolved and she was able to go back to work yesterday.   Has not been able to keep food or fluids down today Did not measure temperature at home  Has had increased urination, denies dysuria Currently on menstrual cycle   The history is provided by medical records and the patient.    Past Medical History:  Diagnosis Date   ADHD (attention deficit hyperactivity disorder)    no meds currently   Anxiety    Cholelithiasis 12/30/2017   Depression    History of seasonal allergies    Pregnancy induced hypertension     Patient Active Problem List   Diagnosis Date Noted   First degree perineal laceration during delivery, delivered 04/22/2023   Periurethral trauma during delivery 04/22/2023   Vaginal delivery 04/21/2023   Mild pre-eclampsia 04/21/2023   Anemia 04/21/2023   Twin pregnancy, twins dichorionic and diamniotic 12/20/2022   Supervision of other normal pregnancy, antepartum 10/18/2022   MDD (major depressive disorder), recurrent episode, severe (HCC) 10/20/2020    Past Surgical History:  Procedure Laterality Date   CHOLECYSTECTOMY N/A 01/29/2018   Procedure: LAPAROSCOPIC CHOLECYSTECTOMY WITH INTRAOPERATIVE CHOLANGIOGRAM;  Surgeon: Chuckie Casimiro KIDD, MD;  Location: [facility];  Service: Pediatrics;  Laterality: N/A;    OB History     Gravida  1   Para  1   Term  0   Preterm  1   AB  0   Living  2      SAB  0   IAB  0    Ectopic  0   Multiple  1   Live Births  2            Home Medications    Prior to Admission medications  Medication Sig Start Date End Date Taking? Authorizing Provider  acetaminophen  (TYLENOL ) 500 MG tablet Take 1 tablet (500 mg total) by mouth every 6 (six) hours as needed. 11/30/24  Yes Rehanna Oloughlin  N, FNP  fluticasone  (FLONASE ) 50 MCG/ACT nasal spray Place 1 spray into both nostrils daily. 11/24/24  Yes Antoniette Peake  N, FNP  ibuprofen  (ADVIL ) 600 MG tablet Take 1 tablet (600 mg total) by mouth every 6 (six) hours as needed. 11/30/24  Yes Shanequa Whitenight  N, FNP  ondansetron  (ZOFRAN -ODT) 4 MG disintegrating tablet Take 1 tablet (4 mg total) by mouth every 8 (eight) hours as needed for nausea or vomiting. 11/30/24  Yes Dreama, Ivery Nanney  N, FNP  promethazine -dextromethorphan (PROMETHAZINE -DM) 6.25-15 MG/5ML syrup Take 5 mLs by mouth 4 (four) times daily as needed for cough. 11/24/24  Yes Alixandria Friedt  N, FNP  benzocaine -Menthol  (DERMOPLAST) 20-0.5 % AERO Apply 1 Application topically as needed for irritation (perineal discomfort). Patient not taking: Reported on 06/06/2023 04/23/23   Richie Harlene RODES, DO  cyclobenzaprine  (FLEXERIL ) 10 MG tablet Take 1 tablet (10 mg total) by mouth 2 (two) times daily as needed for muscle spasms. 09/01/24   Lawsing,  Lynwood, MD  ferrous sulfate  325 (65 FE) MG tablet Take 1 tablet (325 mg total) by mouth every other day. Patient not taking: Reported on 06/06/2023 04/25/23   Richie Harlene RODES, DO  furosemide  (LASIX ) 20 MG tablet Take 1 tablet (20 mg total) by mouth daily for 4 days. 04/24/23 04/28/23  Mercado-Ortiz, Harlene RODES, DO  lidocaine  (LIDODERM ) 5 % Place 1 patch onto the skin daily. Remove & Discard patch within 12 hours or as directed by MD 09/01/24   Jerrol Lynwood, MD  NIFEdipine  (ADALAT  CC) 30 MG 24 hr tablet Take 1 tablet (30 mg total) by mouth 2 (two) times daily. Patient not taking: Reported on 06/06/2023 04/27/23   Marylen Aleck HERO, CNM  senna-docusate (SENOKOT-S) 8.6-50 MG tablet Take 2 tablets by mouth daily. Patient not taking: Reported on 06/06/2023 04/23/23   Richie Harlene RODES, DO  XULANE  150-35 MCG/24HR transdermal patch Place 1 patch onto the skin once a week. 06/06/23   Rudy Carlin LABOR, MD    Family History Family History  Problem Relation Age of Onset   Epilepsy Mother    Schizophrenia Father    Heart disease Brother    Asthma Brother    Epilepsy Brother    Hypertension Maternal Grandfather    Diabetes Maternal Grandfather    Cancer Paternal Grandfather     Social History Social History[1]   Allergies   Patient has no known allergies.   Review of Systems Review of Systems  Per HPI  Physical Exam Triage Vital Signs ED Triage Vitals  Encounter Vitals Group     BP 11/30/24 0849 110/71     Girls Systolic BP Percentile --      Girls Diastolic BP Percentile --      Boys Systolic BP Percentile --      Boys Diastolic BP Percentile --      Pulse Rate 11/30/24 0849 98     Resp 11/30/24 0849 16     Temp 11/30/24 0849 98.8 F (37.1 C)     Temp Source 11/30/24 0849 Oral     SpO2 11/30/24 0849 95 %     Weight --      Height --      Head Circumference --      Peak Flow --      Pain Score 11/30/24 0851 10     Pain Loc --      Pain Education --      Exclude from Growth Chart --    No data found.  Updated Vital Signs BP 110/71   Pulse 98   Temp 98.8 F (37.1 C) (Oral)   Resp 16   LMP 11/25/2024 (Exact Date)   SpO2 95%   Breastfeeding No   Visual Acuity Right Eye Distance:   Left Eye Distance:   Bilateral Distance:    Right Eye Near:   Left Eye Near:    Bilateral Near:     Physical Exam Vitals and nursing note reviewed.  Constitutional:      Appearance: Normal appearance.  HENT:     Head: Normocephalic and atraumatic.     Right Ear: External ear normal.     Left Ear: External ear normal.     Nose: Nose normal.     Mouth/Throat:     Mouth: Mucous  membranes are moist.  Eyes:     Conjunctiva/sclera: Conjunctivae normal.  Cardiovascular:     Rate and Rhythm: Normal rate.  Pulmonary:     Effort: Pulmonary  effort is normal. No respiratory distress.  Abdominal:     General: Abdomen is flat. Bowel sounds are normal.     Palpations: Abdomen is soft.     Tenderness: There is abdominal tenderness. There is no right CVA tenderness, left CVA tenderness, guarding or rebound.  Skin:    General: Skin is warm and dry.  Neurological:     Mental Status: She is alert.  Psychiatric:        Mood and Affect: Mood normal.      UC Treatments / Results  Labs (all labs ordered are listed, but only abnormal results are displayed) Labs Reviewed  POCT URINE DIPSTICK - Abnormal; Notable for the following components:      Result Value   Ketones, POC UA trace (5) (*)    Blood, UA moderate (*)    POC PROTEIN,UA >=300 (*)    All other components within normal limits  URINE CULTURE  POC COVID19/FLU A&B COMBO  POCT URINE PREGNANCY    EKG   Radiology No results found.  Procedures Procedures (including critical care time)  Medications Ordered in UC Medications  ondansetron  (ZOFRAN -ODT) disintegrating tablet 4 mg (4 mg Oral Given 11/30/24 0941)    Initial Impression / Assessment and Plan / UC Course  I have reviewed the triage vital signs and the nursing notes.  Pertinent labs & imaging results that were available during my care of the patient were reviewed by me and considered in my medical decision making (see chart for details).  Vitals and triage reviewed, patient is hemodynamically stable.  Abdomen is soft with active bowel sounds.  Does have lower abdominal tenderness, without rebound or guarding.  Negative for CVA tenderness.  Does have increased frequency of urination.  UA without evidence of infection, red blood cells could be due to menstrual cycle.  Will send for culture.  Nausea and abdominal discomfort improved after ODT  Zofran .  Suspect viral illness, symptomatic management discussed.  Plan of care, follow-up care and return precautions given, no questions at this time.  Work note provided.     Final Clinical Impressions(s) / UC Diagnoses   Final diagnoses:  Body aches  Acute bilateral low back pain without sciatica  Nausea and vomiting, unspecified vomiting type  Urinary frequency  Viral syndrome     Discharge Instructions      COVID and flu testing were negative, you most likely have a different viral illness.  Use the nausea medicine every 8 hours as needed and follow a bland diet such as bananas, rice, toast and applesauce.  For body aches and chills you can alternate between Tylenol  and ibuprofen  every 4-6 hours.  Ensure you are staying well-hydrated getting plenty of rest.  Symptoms should improve over the next 5 to 7 days.  If no improvement or any changes seek follow-up care.     ED Prescriptions     Medication Sig Dispense Auth. Provider   ondansetron  (ZOFRAN -ODT) 4 MG disintegrating tablet Take 1 tablet (4 mg total) by mouth every 8 (eight) hours as needed for nausea or vomiting. 20 tablet Dreama, Jamison Soward  N, FNP   acetaminophen  (TYLENOL ) 500 MG tablet Take 1 tablet (500 mg total) by mouth every 6 (six) hours as needed. 30 tablet Dreama, Candia Kingsbury  N, FNP   ibuprofen  (ADVIL ) 600 MG tablet Take 1 tablet (600 mg total) by mouth every 6 (six) hours as needed. 30 tablet Dreama, Anjelita Sheahan  N, FNP      PDMP not reviewed this encounter.     [  1]  Social History Tobacco Use   Smoking status: Never   Smokeless tobacco: Never  Vaping Use   Vaping status: Never Used  Substance Use Topics   Alcohol use: Not Currently   Drug use: Not Currently    Types: Marijuana     Dreama Baldomero SAILOR, FNP 11/30/24 [phone]

## 2024-12-01 ENCOUNTER — Ambulatory Visit (HOSPITAL_COMMUNITY): Payer: Self-pay

## 2024-12-01 LAB — URINE CULTURE: Culture: 5000 — AB

## 2025-01-14 ENCOUNTER — Ambulatory Visit
Admission: RE | Admit: 2025-01-14 | Discharge: 2025-01-14 | Disposition: A | Discharge status: Home / Self Care | Payer: Self-pay | Source: Ambulatory Visit | Attending: Nurse Practitioner | Admitting: Nurse Practitioner

## 2025-01-14 VITALS — BP 115/66 | HR 90 | Temp 98.3°F | Resp 17

## 2025-01-14 DIAGNOSIS — Z3202 Encounter for pregnancy test, result negative: Secondary | ICD-10-CM

## 2025-01-14 DIAGNOSIS — N3001 Acute cystitis with hematuria: Secondary | ICD-10-CM | POA: Insufficient documentation

## 2025-01-14 LAB — POCT URINE DIPSTICK
Bilirubin, UA: NEGATIVE
Glucose, UA: NEGATIVE mg/dL
Ketones, POC UA: NEGATIVE mg/dL
Nitrite, UA: POSITIVE — AB
Protein Ur, POC: 100 mg/dL — AB
Spec Grav, UA: 1.025
Urobilinogen, UA: 1 U/dL
pH, UA: 6.5

## 2025-01-14 LAB — POCT URINE PREGNANCY: Preg Test, Ur: NEGATIVE

## 2025-01-14 MED ORDER — CEPHALEXIN 500 MG PO CAPS: 500.0000 mg | ORAL_CAPSULE | Freq: Two times a day (BID) | ORAL | 0 refills | Status: AC

## 2025-01-14 MED ORDER — PHENAZOPYRIDINE HCL 200 MG PO TABS: 200.0000 mg | ORAL_TABLET | Freq: Three times a day (TID) | ORAL | 0 refills | Status: AC | PRN

## 2025-01-14 NOTE — Discharge Instructions (Signed)
 You have a urinary tract infection.  Take the Keflex  (antibiotic) twice daily for 5 days as prescribed to treat it.  You can also take the Pyridium  every 8 hours as needed for bladder pain-this will die your urine bright orange so do not be alarmed.  We will contact you if the urine culture shows we need to change the antibiotic early next week.  In the meantime, recommend hydration with lots of water and avoiding bladder stimulants like caffeine, carbonated beverages, chocolate, acidic foods like tomatoes and spicy food.

## 2025-01-14 NOTE — ED Provider Notes (Signed)
 " [facility]    CSN: [id number] Arrival date & time: 01/14/25  1058      History   Chief Complaint Chief Complaint  Patient presents with   Dysuria    HPI Sonya [name] is a 24 y.o. female.   Patient presents today with 3 to 4-day history of urinary frequency and urgency, voiding smaller amounts, cloudy urine, suprapubic pain that occasionally shoots up to her lower abdomen, nausea, bilateral back pain, and decreased appetite.  She denies burning with urination, foul urinary odor, hematuria, fever, body aches, and chills, and abnormal vaginal discharge.  Reports history of UTI, last while pregnant more than 1 year ago.  Has tried pure cranberry juice without relief.     Past Medical History:  Diagnosis Date   ADHD (attention deficit hyperactivity disorder)    no meds currently   Anxiety    Cholelithiasis 12/30/2017   Depression    History of seasonal allergies    Pregnancy induced hypertension     Patient Active Problem List   Diagnosis Date Noted   First degree perineal laceration during delivery, delivered 04/22/2023   Periurethral trauma during delivery 04/22/2023   Vaginal delivery 04/21/2023   Mild pre-eclampsia 04/21/2023   Anemia 04/21/2023   Twin pregnancy, twins dichorionic and diamniotic 12/20/2022   Supervision of other normal pregnancy, antepartum 10/18/2022   MDD (major depressive disorder), recurrent episode, severe (HCC) 10/20/2020    Past Surgical History:  Procedure Laterality Date   CHOLECYSTECTOMY N/A 01/29/2018   Procedure: LAPAROSCOPIC CHOLECYSTECTOMY WITH INTRAOPERATIVE CHOLANGIOGRAM;  Surgeon: Chuckie Casimiro KIDD, MD;  Location: [facility];  Service: Pediatrics;  Laterality: N/A;    OB History     Gravida  1   Para  1   Term  0   Preterm  1   AB  0   Living  2      SAB  0   IAB  0   Ectopic  0   Multiple  1   Live Births  2            Home Medications    Prior to Admission medications  Medication Sig  Start Date End Date Taking? Authorizing Provider  cephALEXin  (KEFLEX ) 500 MG capsule Take 1 capsule (500 mg total) by mouth 2 (two) times daily for 5 days. 01/14/25 01/19/25 Yes Chandra Harlene LABOR, NP  phenazopyridine  (PYRIDIUM ) 200 MG tablet Take 1 tablet (200 mg total) by mouth 3 (three) times daily as needed for pain. 01/14/25  Yes Chandra Harlene LABOR, NP  acetaminophen  (TYLENOL ) 500 MG tablet Take 1 tablet (500 mg total) by mouth every 6 (six) hours as needed. 11/30/24   Ball, [place]  G, FNP  ibuprofen  (ADVIL ) 600 MG tablet Take 1 tablet (600 mg total) by mouth every 6 (six) hours as needed. 11/30/24   Mercer, [place]  G, FNP    Family History Family History  Problem Relation Age of Onset   Epilepsy Mother    Schizophrenia Father    Heart disease Brother    Asthma Brother    Epilepsy Brother    Hypertension Maternal Grandfather    Diabetes Maternal Grandfather    Cancer Paternal Grandfather     Social History Social History[1]   Allergies   Patient has no known allergies.   Review of Systems Review of Systems Per HPI  Physical Exam Triage Vital Signs ED Triage Vitals  Encounter Vitals Group     BP 01/14/25 1124 115/66     Girls  Systolic BP Percentile --      Girls Diastolic BP Percentile --      Boys Systolic BP Percentile --      Boys Diastolic BP Percentile --      Pulse Rate 01/14/25 1124 90     Resp 01/14/25 1124 17     Temp 01/14/25 1124 98.3 F (36.8 C)     Temp Source 01/14/25 1124 Oral     SpO2 01/14/25 1124 98 %     Weight --      Height --      Head Circumference --      Peak Flow --      Pain Score 01/14/25 1121 10     Pain Loc --      Pain Education --      Exclude from Growth Chart --    No data found.  Updated Vital Signs BP 115/66 (BP Location: Left Arm)   Pulse 90   Temp 98.3 F (36.8 C) (Oral)   Resp 17   LMP 12/30/2024   SpO2 98%   Breastfeeding No   Visual Acuity Right Eye Distance:   Left Eye Distance:   Bilateral Distance:     Right Eye Near:   Left Eye Near:    Bilateral Near:     Physical Exam Vitals and nursing note reviewed.  Constitutional:      General: She is not in acute distress.    Appearance: She is not toxic-appearing.  Pulmonary:     Effort: Pulmonary effort is normal. No respiratory distress.  Abdominal:     General: Abdomen is flat. Bowel sounds are normal. There is no distension.     Palpations: Abdomen is soft. There is no mass.     Tenderness: There is no abdominal tenderness. There is no right CVA tenderness, left CVA tenderness or guarding.  Skin:    General: Skin is warm and dry.     Coloration: Skin is not jaundiced or pale.     Findings: No erythema.  Neurological:     Mental Status: She is alert and oriented to person, place, and time.     Motor: No weakness.     Gait: Gait normal.  Psychiatric:        Behavior: Behavior is cooperative.      UC Treatments / Results  Labs (all labs ordered are listed, but only abnormal results are displayed) Labs Reviewed  POCT URINE DIPSTICK - Abnormal; Notable for the following components:      Result Value   Clarity, UA cloudy (*)    Blood, UA moderate (*)    Protein Ur, POC =100 (*)    Nitrite, UA Positive (*)    Leukocytes, UA Small (1+) (*)    All other components within normal limits  POCT URINE PREGNANCY - Normal  URINE CULTURE    EKG   Radiology No results found.  Procedures Procedures (including critical care time)  Medications Ordered in UC Medications - No data to display  Initial Impression / Assessment and Plan / UC Course  I have reviewed the triage vital signs and the nursing notes.  Pertinent labs & imaging results that were available during my care of the patient were reviewed by me and considered in my medical decision making (see chart for details).   Patient is a very pleasant, well-appearing 24 year old female presenting today for urinary frequency and acute cystitis.  Vital signs are stable and  exam is reassuring.  Urinalysis  today is cloudy with moderate blood, positive nitrites.  Urine culture is pending.  Will treat for acute cystitis with Keflex  500 mg twice daily for 5 days.  Also recommended starting Pyridium  to help with bladder pain.  Encouraged hydration plenty of water.  Strict ER precautions discussed.  The patient was given the opportunity to ask questions.  All questions answered to their satisfaction.  The patient is in agreement to this plan.   Final Clinical Impressions(s) / UC Diagnoses   Final diagnoses:  Acute cystitis with hematuria  Urine pregnancy test negative     Discharge Instructions      You have a urinary tract infection.  Take the Keflex  (antibiotic) twice daily for 5 days as prescribed to treat it.  You can also take the Pyridium  every 8 hours as needed for bladder pain-this will die your urine bright orange so do not be alarmed.  We will contact you if the urine culture shows we need to change the antibiotic early next week.  In the meantime, recommend hydration with lots of water and avoiding bladder stimulants like caffeine, carbonated beverages, chocolate, acidic foods like tomatoes and spicy food.     ED Prescriptions     Medication Sig Dispense Auth. Provider   phenazopyridine  (PYRIDIUM ) 200 MG tablet Take 1 tablet (200 mg total) by mouth 3 (three) times daily as needed for pain. 6 tablet Chandra Raisin A, NP   cephALEXin  (KEFLEX ) 500 MG capsule Take 1 capsule (500 mg total) by mouth 2 (two) times daily for 5 days. 10 capsule Chandra Raisin LABOR, NP      PDMP not reviewed this encounter.     [1]  Social History Tobacco Use   Smoking status: Never   Smokeless tobacco: Never  Vaping Use   Vaping status: Never Used  Substance Use Topics   Alcohol use: Not Currently   Drug use: Not Currently    Types: Marijuana     Chandra Raisin LABOR, NP 01/14/25 1221  "

## 2025-01-14 NOTE — ED Triage Notes (Signed)
 Pt has c/o lower abdomen, lower back pain, dysuria x 3 days. Pt wants to rule out UTI symptoms. Pt has taken ibuprofen  at home yesterday for the pain.

## 2025-01-16 ENCOUNTER — Ambulatory Visit (HOSPITAL_COMMUNITY): Payer: Self-pay

## 2025-01-16 LAB — URINE CULTURE: Culture: >=100000 — AB
